# Patient Record
Sex: Female | Born: 1985 | Race: White | Hispanic: No | Smoking: Never smoker
Health system: Southern US, Community
[De-identification: ages and names within clinical notes are randomized; demographics above are authoritative.]

## PROBLEM LIST (undated history)

## (undated) DIAGNOSIS — R519 Headache, unspecified: Secondary | ICD-10-CM

## (undated) DIAGNOSIS — F32A Depression, unspecified: Secondary | ICD-10-CM

## (undated) DIAGNOSIS — R51 Headache: Secondary | ICD-10-CM

## (undated) DIAGNOSIS — J45909 Unspecified asthma, uncomplicated: Secondary | ICD-10-CM

## (undated) DIAGNOSIS — J069 Acute upper respiratory infection, unspecified: Secondary | ICD-10-CM

## (undated) DIAGNOSIS — F329 Major depressive disorder, single episode, unspecified: Secondary | ICD-10-CM

## (undated) HISTORY — DX: Acute upper respiratory infection, unspecified: J06.9

## (undated) HISTORY — DX: Unspecified asthma, uncomplicated: J45.909

## (undated) HISTORY — PX: LIPOSUCTION MULTIPLE BODY PARTS: SUR832

## (undated) HISTORY — PX: OTHER SURGICAL HISTORY: SHX169

---

## 2013-07-12 ENCOUNTER — Ambulatory Visit (INDEPENDENT_AMBULATORY_CARE_PROVIDER_SITE_OTHER): Payer: BC Managed Care – PPO | Admitting: Sports Medicine

## 2013-07-12 ENCOUNTER — Encounter: Payer: Self-pay | Admitting: Sports Medicine

## 2013-07-12 VITALS — BP 126/79 | HR 66 | Wt 180.0 lb

## 2013-07-12 DIAGNOSIS — F329 Major depressive disorder, single episode, unspecified: Secondary | ICD-10-CM | POA: Insufficient documentation

## 2013-07-12 DIAGNOSIS — J3089 Other allergic rhinitis: Secondary | ICD-10-CM | POA: Insufficient documentation

## 2013-07-12 DIAGNOSIS — Z299 Encounter for prophylactic measures, unspecified: Secondary | ICD-10-CM

## 2013-07-12 DIAGNOSIS — R41840 Attention and concentration deficit: Secondary | ICD-10-CM

## 2013-07-12 DIAGNOSIS — J309 Allergic rhinitis, unspecified: Secondary | ICD-10-CM

## 2013-07-12 DIAGNOSIS — E669 Obesity, unspecified: Secondary | ICD-10-CM

## 2013-07-12 DIAGNOSIS — E6609 Other obesity due to excess calories: Secondary | ICD-10-CM | POA: Insufficient documentation

## 2013-07-12 MED ORDER — FLUTICASONE PROPIONATE 50 MCG/ACT NA SUSP: NASAL | Status: DC

## 2013-07-12 MED ORDER — PHENTERMINE HCL 37.5 MG PO TABS: 37.5000 mg | ORAL_TABLET | Freq: Every day | ORAL | Status: DC

## 2013-07-12 MED ORDER — TOPIRAMATE 50 MG PO TABS: ORAL_TABLET | ORAL | Status: DC

## 2013-07-12 NOTE — Assessment & Plan Note (Signed)
Continue oral antihistamine. Flonase. Return as needed.

## 2013-07-12 NOTE — Assessment & Plan Note (Signed)
Up-to-date of cervical cancer screening, uses [facility] [place].

## 2013-07-12 NOTE — Assessment & Plan Note (Signed)
Sending to Dr. Dellia Cloud for ADHD testing.

## 2013-07-12 NOTE — Progress Notes (Signed)
  Subjective:    CC: Establish care.   HPI:  Obesity: Has tried exercise, works out approximately 6-7 days per week, has not been able to lose much weight, has not really worked on dieting. She has had a tummy tuck x2 as well as liposuction.  Raynaud's: Likely allergic rhinitis, takes an occasional Zyrtec without much improvement.  Difficulty concentrating: Often loses track of what she is doing, and has difficulty focusing, she was on phentermine in the past and noted a slight improvement in her symptoms, she wonders if she may have adult ADHD.  Preventative measures: Up-to-date on all screening measures, gets Pap sm[facility] through [facility] [place].  Past medical history, Surgical history, Family history not pertinant except as noted below, Social history, Allergies, and medications have been entered into the medical record, reviewed, and no changes needed.   Review of Systems: No headache, visual changes, nausea, vomiting, diarrhea, constipation, dizziness, abdominal pain, skin rash, fevers, chills, night sweats, swollen lymph nodes, weight loss, chest pain, body aches, joint swelling, muscle aches, shortness of breath, mood changes, visual or auditory hallucinations.  Objective:    General: Well Developed, well nourished, and in no acute distress.  Neuro: Alert and oriented x3, extra-ocular muscles intact, sensation grossly intact.  HEENT: Normocephalic, atraumatic, pupils equal round reactive to light, neck supple, no masses, no lymphadenopathy, thyroid nonpalpable.  Skin: Warm and dry, no rashes noted.  Cardiac: Regular rate and rhythm, no murmurs rubs or gallops.  Respiratory: Clear to auscultation bilaterally. Not using accessory muscles, speaking in full sentences.  Abdominal: Soft, nontender, nondistended, positive bowel sounds, no masses, no organomegaly.  Musculoskeletal: Shoulder, elbow, wrist, hip, knee, ankle stable, and with full range of motion. Impression  and Recommendations:    The patient was counselled, risk factors were discussed, anticipatory guidance given.

## 2013-07-12 NOTE — Assessment & Plan Note (Signed)
Status post liposuction, tummy tuck x2. Phentermine, Topamax, nutritionist referral.

## 2013-08-01 LAB — HEMOGLOBIN A1C
Hgb A1c MFr Bld: 4.9 % (ref <5.7)
Mean Plasma Glucose: 94 mg/dL (ref <117)

## 2013-08-01 LAB — CBC
HCT: 38 % (ref 36.0–46.0)
Hemoglobin: 13.4 g/dL (ref 12.0–15.0)
MCH: 32.4 pg (ref 26.0–34.0)
MCHC: 35.3 g/dL (ref 30.0–36.0)
MCV: 91.8 fL (ref 78.0–100.0)
Platelets: 173 10*3/uL (ref 150–400)
RBC: 4.14 MIL/uL (ref 3.87–5.11)
RDW: 13.5 % (ref 11.5–15.5)
WBC: 6.3 10*3/uL (ref 4.0–10.5)

## 2013-08-01 LAB — COMPREHENSIVE METABOLIC PANEL WITH GFR
ALT: 10 U/L (ref 0–35)
AST: 13 U/L (ref 0–37)
Albumin: 4.2 g/dL (ref 3.5–5.2)
Alkaline Phosphatase: 70 U/L (ref 39–117)
BUN: 11 mg/dL (ref 6–23)
CO2: 23 meq/L (ref 19–32)
Calcium: 9.4 mg/dL (ref 8.4–10.5)
Chloride: 107 meq/L (ref 96–112)
Creat: 0.84 mg/dL (ref 0.50–1.10)
Glucose, Bld: 87 mg/dL (ref 70–99)
Potassium: 4.3 meq/L (ref 3.5–5.3)
Sodium: 136 meq/L (ref 135–145)
Total Bilirubin: 0.8 mg/dL (ref 0.3–1.2)
Total Protein: 6.8 g/dL (ref 6.0–8.3)

## 2013-08-01 LAB — TSH: TSH: 2.339 u[IU]/mL (ref 0.350–4.500)

## 2013-08-01 LAB — LIPID PANEL
Cholesterol: 84 mg/dL (ref 0–200)
HDL: 44 mg/dL (ref >39)
LDL Cholesterol: 26 mg/dL (ref 0–99)
Total CHOL/HDL Ratio: 1.9 Ratio
Triglycerides: 70 mg/dL (ref <150)
VLDL: 14 mg/dL (ref 0–40)

## 2013-08-09 ENCOUNTER — Ambulatory Visit (INDEPENDENT_AMBULATORY_CARE_PROVIDER_SITE_OTHER): Payer: BC Managed Care – PPO | Admitting: Sports Medicine

## 2013-08-09 ENCOUNTER — Encounter: Payer: Self-pay | Admitting: Sports Medicine

## 2013-08-09 VITALS — BP 126/86 | HR 86 | Wt 170.0 lb

## 2013-08-09 DIAGNOSIS — R41840 Attention and concentration deficit: Secondary | ICD-10-CM

## 2013-08-09 DIAGNOSIS — Z299 Encounter for prophylactic measures, unspecified: Secondary | ICD-10-CM

## 2013-08-09 DIAGNOSIS — Z23 Encounter for immunization: Secondary | ICD-10-CM

## 2013-08-09 DIAGNOSIS — E669 Obesity, unspecified: Secondary | ICD-10-CM

## 2013-08-09 MED ORDER — PHENTERMINE HCL 37.5 MG PO TABS: 37.5000 mg | ORAL_TABLET | Freq: Every day | ORAL | Status: DC

## 2013-08-09 MED ORDER — TOPIRAMATE 50 MG PO TABS: 50.0000 mg | ORAL_TABLET | ORAL | Status: DC

## 2013-08-09 NOTE — Assessment & Plan Note (Addendum)
10 pound weight loss in the past month. Switching Topamax to the morning, continue phentermine. Still awaiting nutritionist visit.

## 2013-08-09 NOTE — Progress Notes (Signed)
  Subjective:    CC: Followup  HPI: Obesity: 10 pound weight loss since starting phentermine and Topamax, she did go on vacation and had some indiscretions with eating over the past 2 weeks. Has not yet seen the nutritionist.  Difficulty concentrating: Is going to set up appointment with Dr. Dellia Cloud for ADD testing.  Preventive measure: Up-to-date on cervical cancer screening, due for influenza and tetanus shots.  Past medical history, Surgical history, Family history not pertinant except as noted below, Social history, Allergies, and medications have been entered into the medical record, reviewed, and no changes needed.   Review of Systems: No fevers, chills, night sweats, weight loss, chest pain, or shortness of breath.   Objective:    General: Well Developed, well nourished, and in no acute distress.  Neuro: Alert and oriented x3, extra-ocular muscles intact, sensation grossly intact.  HEENT: Normocephalic, atraumatic, pupils equal round reactive to light, neck supple, no masses, no lymphadenopathy, thyroid nonpalpable.  Skin: Warm and dry, no rashes. Cardiac: Regular rate and rhythm, no murmurs rubs or gallops, no lower extremity edema.  Respiratory: Clear to auscultation bilaterally. Not using accessory muscles, speaking in full sentences.  Impression and Recommendations:

## 2013-08-09 NOTE — Assessment & Plan Note (Signed)
Up-to-date of cervical cancer screening, tetanus and flu vaccinations given today.

## 2013-08-09 NOTE — Assessment & Plan Note (Signed)
Still awaiting Dr. Dawayne Cirri ADD evaluation.

## 2013-09-02 ENCOUNTER — Encounter: Payer: Self-pay | Admitting: *Deleted

## 2013-09-02 ENCOUNTER — Encounter: Payer: BC Managed Care – PPO | Attending: Sports Medicine | Admitting: *Deleted

## 2013-09-02 VITALS — Ht 64.0 in | Wt 166.7 lb

## 2013-09-02 DIAGNOSIS — E669 Obesity, unspecified: Secondary | ICD-10-CM | POA: Insufficient documentation

## 2013-09-02 DIAGNOSIS — Z713 Dietary counseling and surveillance: Secondary | ICD-10-CM | POA: Insufficient documentation

## 2013-09-02 NOTE — Progress Notes (Signed)
Medical Nutrition Therapy:  Appt start time: 1045 end time:  1145.  Assessment:  Patient here today for weight management counseling. She has been on phentermine and Topamax for the last 2 months, and has already lost about 14 pounds. Weight loss has slowed in the last month, but is still 1 pound per week. She states that her appetite is decreased, which has been controlling portion control and appetite. She states that prior to taking medication, her biggest issue was overeating at meals, and snacking frequently. She knows how to eat healthy, but self-control is an issue. She has been exercising regularly for the last 2 years. Goal weight is about 150 pounds.   MEDICATIONS: Phentermine, topamax   DIETARY INTAKE:   Usual eating pattern includes 2-3 meals and 0 snacks per day.  24-hr recall:  B (7 AM): Subway flatbread (egg white, ham, mustard) OR Fiber One bar/protein shake with fruit/PB  Snk ( AM): None  L ( PM): Salad (chef or chicken), Ranch /vinegrette Snk ( PM): None D ( PM): Sometimes skips, lettuce wraps (chicken) Snk ( PM): None Beverages: Water  Usual physical activity: 4 days a week, cross training/circuit training/running, 60 minutes  Estimated energy needs: 1400 calories 175 g carbohydrates 88 g protein 39 g fat  Progress Towards Goal(s):  In progress.   Nutritional Diagnosis:  [place]-3.3 Overweight/obesity As related to history of excessive energy intake.  As evidenced by BMI 28.7.    Intervention:  Nutrition counseling. We discussed strategies for weight loss, including balancing nutrients (carbs, protein, fat), portion control, healthy snacks, and exercise.   Goals:  1. 1 pound weight loss per week. Goal weight: 150 pounds 2. Balance nutrients at meals with high intake of non-starchy vegetables.  3. Monitor portion size.  4. If needed, plan for healthy snacks and limit portions.  5. Continue exercising as currently.   Handouts given during visit include:  Yellow  meal plan card  Monitoring/Evaluation:  Dietary intake, exercise, and body weight prn.

## 2013-09-07 ENCOUNTER — Encounter: Payer: Self-pay | Admitting: Sports Medicine

## 2013-09-07 ENCOUNTER — Ambulatory Visit (INDEPENDENT_AMBULATORY_CARE_PROVIDER_SITE_OTHER): Payer: BC Managed Care – PPO | Admitting: Sports Medicine

## 2013-09-07 VITALS — BP 115/78 | HR 77 | Wt 162.0 lb

## 2013-09-07 DIAGNOSIS — E669 Obesity, unspecified: Secondary | ICD-10-CM

## 2013-09-07 MED ORDER — TOPIRAMATE 50 MG PO TABS: 50.0000 mg | ORAL_TABLET | ORAL | Status: DC

## 2013-09-07 MED ORDER — PHENTERMINE HCL 37.5 MG PO TABS: 37.5000 mg | ORAL_TABLET | Freq: Every day | ORAL | Status: DC

## 2013-09-07 NOTE — Assessment & Plan Note (Signed)
Total of approximately 20 pound weight loss in 2 months. Refilling phentermine and Topamax. She does have some problem areas around the love handles and upper gluteal region, we talked in depth regarding how phentermine cannot target specific areas. She has had an abdominoplasty in the past and does not desire an additional scar, plastic surgeon does recommend a repeat operation. I have recommended cool sculpting as a possible way to target her problem areas, she will discuss this with her plastic surgeon.

## 2013-09-07 NOTE — Progress Notes (Signed)
  Subjective:    CC: Followup  HPI: Obesity: This pleasant 27 year old female comes back for followup of weight loss, she is now lost approximately 20 pounds we initially started her phentermine treatment. Unfortunately she continues to have a problem area with some redundant skin over her left and a little bit over her right upper gluteal area. She has had an abdominoplasty in the distant past, and desires not to proceed with any further surgical intervention at this time.  Past medical history, Surgical history, Family history not pertinant except as noted below, Social history, Allergies, and medications have been entered into the medical record, reviewed, and no changes needed.   Review of Systems: No fevers, chills, night sweats, weight loss, chest pain, or shortness of breath.   Objective:    General: Well Developed, well nourished, and in no acute distress.  Neuro: Alert and oriented x3, extra-ocular muscles intact, sensation grossly intact.  HEENT: Normocephalic, atraumatic, pupils equal round reactive to light, neck supple, no masses, no lymphadenopathy, thyroid nonpalpable.  Skin: Warm and dry, no rashes. She does have some redundancy of skin over her left and right upper gluteal areas, her prior abdominoplasty scars seems to have healed with hypertrophic scarring. Cardiac: Regular rate and rhythm, no murmurs rubs or gallops, no lower extremity edema.  Respiratory: Clear to auscultation bilaterally. Not using accessory muscles, speaking in full sentences.  Impression and Recommendations:

## 2013-09-29 ENCOUNTER — Ambulatory Visit: Payer: BC Managed Care – PPO | Admitting: Sports Medicine

## 2013-10-05 ENCOUNTER — Ambulatory Visit (INDEPENDENT_AMBULATORY_CARE_PROVIDER_SITE_OTHER): Payer: BC Managed Care – PPO | Admitting: Sports Medicine

## 2013-10-05 ENCOUNTER — Encounter: Payer: Self-pay | Admitting: Sports Medicine

## 2013-10-05 VITALS — BP 116/71 | HR 93 | Wt 161.0 lb

## 2013-10-05 DIAGNOSIS — E669 Obesity, unspecified: Secondary | ICD-10-CM

## 2013-10-05 MED ORDER — PHENTERMINE HCL 37.5 MG PO CAPS: 37.5000 mg | ORAL_CAPSULE | ORAL | Status: DC

## 2013-10-05 NOTE — Progress Notes (Signed)
  Subjective:    CC: Followup  HPI: Obesity: This very pleasant 27 year old female comes back to see me for followup of weight loss treatment. Since her initial visit she's lost approximately 20 pounds. Unfortunately over the last month she's only lost a single pound and triggered this to dietary indiscretions during the holidays. She also has not been using her Topamax. She has no side effects and is eager to get back on the bandwagon.  Past medical history, Surgical history, Family history not pertinant except as noted below, Social history, Allergies, and medications have been entered into the medical record, reviewed, and no changes needed.   Review of Systems: No fevers, chills, night sweats, weight loss, chest pain, or shortness of breath.   Objective:    General: Well Developed, well nourished, and in no acute distress.  Neuro: Alert and oriented x3, extra-ocular muscles intact, sensation grossly intact.  HEENT: Normocephalic, atraumatic, pupils equal round reactive to light, neck supple, no masses, no lymphadenopathy, thyroid nonpalpable.  Skin: Warm and dry, no rashes. Cardiac: Regular rate and rhythm, no murmurs rubs or gallops, no lower extremity edema.  Respiratory: Clear to auscultation bilaterally. Not using accessory muscles, speaking in full sentences.  Impression and Recommendations:

## 2013-10-05 NOTE — Assessment & Plan Note (Signed)
20 pound weight loss since starting phentermine, however unfortunately since last month has only lost 1 pound. She attributes this to forgetting her Topamax and dietary indiscretion through the holidays. We will refill the medicine, we'll see her back in one month. She will get back on the bandwagon.

## 2013-11-04 ENCOUNTER — Ambulatory Visit: Payer: BC Managed Care – PPO | Admitting: Sports Medicine

## 2013-11-04 DIAGNOSIS — Z0289 Encounter for other administrative examinations: Secondary | ICD-10-CM

## 2013-11-17 ENCOUNTER — Encounter: Payer: Self-pay | Admitting: Sports Medicine

## 2013-11-17 ENCOUNTER — Ambulatory Visit (INDEPENDENT_AMBULATORY_CARE_PROVIDER_SITE_OTHER): Payer: BC Managed Care – PPO | Admitting: Sports Medicine

## 2013-11-17 VITALS — BP 125/82 | HR 74 | Ht 64.0 in | Wt 167.0 lb

## 2013-11-17 DIAGNOSIS — J0100 Acute maxillary sinusitis, unspecified: Secondary | ICD-10-CM

## 2013-11-17 MED ORDER — AZITHROMYCIN 250 MG PO TABS: ORAL_TABLET | ORAL | Status: DC

## 2013-11-17 NOTE — Patient Instructions (Signed)

## 2013-11-17 NOTE — Progress Notes (Signed)
  Subjective:    CC: Sick  HPI: For the past week Michelle Berger has had sinus pain and pressure radiating to her left ear, nasal discharge, coughing. No GI symptoms. Symptoms are moderate, persistent.  Past medical history, Surgical history, Family history not pertinant except as noted below, Social history, Allergies, and medications have been entered into the medical record, reviewed, and no changes needed.   Review of Systems: No fevers, chills, night sweats, weight loss, chest pain, or shortness of breath.   Objective:    General: Well Developed, well nourished, and in no acute distress.  Neuro: Alert and oriented x3, extra-ocular muscles intact, sensation grossly intact.  HEENT: Normocephalic, atraumatic, pupils equal round reactive to light, neck supple, no masses, no lymphadenopathy, thyroid nonpalpable. Tender to palpation over the maxillary sinuses, oropharynx, external ear canals are unremarkable, nasopharynx shows boggy and erythematous turbinates. Skin: Warm and dry, no rashes. Cardiac: Regular rate and rhythm, no murmurs rubs or gallops, no lower extremity edema.  Respiratory: Clear to auscultation bilaterally. Not using accessory muscles, speaking in full sentences.  Impression and Recommendations:

## 2013-11-17 NOTE — Assessment & Plan Note (Signed)
Azithromycin, Flonase, return as needed 

## 2013-12-01 ENCOUNTER — Ambulatory Visit: Payer: BC Managed Care – PPO | Admitting: Sports Medicine

## 2013-12-02 ENCOUNTER — Encounter: Payer: Self-pay | Admitting: Sports Medicine

## 2013-12-02 ENCOUNTER — Ambulatory Visit (INDEPENDENT_AMBULATORY_CARE_PROVIDER_SITE_OTHER): Payer: BC Managed Care – PPO | Admitting: Sports Medicine

## 2013-12-02 VITALS — BP 121/71 | HR 100 | Ht 64.0 in | Wt 163.0 lb

## 2013-12-02 DIAGNOSIS — Z299 Encounter for prophylactic measures, unspecified: Secondary | ICD-10-CM

## 2013-12-02 DIAGNOSIS — E669 Obesity, unspecified: Secondary | ICD-10-CM

## 2013-12-02 MED ORDER — TOPIRAMATE 50 MG PO TABS: 50.0000 mg | ORAL_TABLET | ORAL | Status: DC

## 2013-12-02 MED ORDER — PHENTERMINE HCL 37.5 MG PO CAPS: 37.5000 mg | ORAL_CAPSULE | ORAL | Status: DC

## 2013-12-02 MED ORDER — HYDROXYZINE HCL 50 MG PO TABS: 50.0000 mg | ORAL_TABLET | Freq: Every day | ORAL | Status: DC

## 2013-12-02 NOTE — Progress Notes (Signed)
  Subjective:    CC: Followup  HPI: Sinus infection: Resolved.  Obesity: Has fallen off the bandwagon and missed month of phentermine, she has gained 3 pounds.  Travel: Going [place] next week, needs medicine to help her sleep on the plane. She does have plenty of scopolamine for motion sickness.   Past medical history, Surgical history, Family history not pertinant except as noted below, Social history, Allergies, and medications have been entered into the medical record, reviewed, and no changes needed.   Review of Systems: No fevers, chills, night sweats, weight loss, chest pain, or shortness of breath.   Objective:    General: Well Developed, well nourished, and in no acute distress.  Neuro: Alert and oriented x3, extra-ocular muscles intact, sensation grossly intact.  HEENT: Normocephalic, atraumatic, pupils equal round reactive to light, neck supple, no masses, no lymphadenopathy, thyroid nonpalpable.  Skin: Warm and dry, no rashes. Cardiac: Regular rate and rhythm, no murmurs rubs or gallops, no lower extremity edema.  Respiratory: Clear to auscultation bilaterally. Not using accessory muscles, speaking in full sentences.  Impression and Recommendations:

## 2013-12-02 NOTE — Assessment & Plan Note (Signed)
Refilling phentermine and Topamax. She needs to get back on the band wagon. Return in one month for a weight check and refills, I think 140 pounds would look best on her. She agrees that this is her goal weight.

## 2013-12-02 NOTE — Assessment & Plan Note (Signed)
Going [place]. She does have plenty of scopolamine patches. Adding hydroxyzine for sedation on the plane.

## 2013-12-30 ENCOUNTER — Ambulatory Visit: Payer: BC Managed Care – PPO | Admitting: Sports Medicine

## 2013-12-30 DIAGNOSIS — Z0289 Encounter for other administrative examinations: Secondary | ICD-10-CM

## 2014-05-31 ENCOUNTER — Emergency Department
Admission: EM | Admit: 2014-05-31 | Discharge: 2014-05-31 | Disposition: A | Discharge status: Home / Self Care | Payer: BC Managed Care – PPO | Source: Home / Self Care | Attending: Family Medicine | Admitting: Family Medicine

## 2014-05-31 ENCOUNTER — Emergency Department (INDEPENDENT_AMBULATORY_CARE_PROVIDER_SITE_OTHER): Payer: BC Managed Care – PPO

## 2014-05-31 ENCOUNTER — Encounter: Payer: Self-pay | Admitting: Emergency Medicine

## 2014-05-31 DIAGNOSIS — B9789 Other viral agents as the cause of diseases classified elsewhere: Secondary | ICD-10-CM

## 2014-05-31 DIAGNOSIS — J069 Acute upper respiratory infection, unspecified: Secondary | ICD-10-CM

## 2014-05-31 DIAGNOSIS — R51 Headache: Secondary | ICD-10-CM

## 2014-05-31 HISTORY — DX: Headache: R51

## 2014-05-31 HISTORY — DX: Headache, unspecified: R51.9

## 2014-05-31 LAB — POCT CBC W AUTO DIFF (K'VILLE URGENT CARE)

## 2014-05-31 MED ORDER — KETOROLAC TROMETHAMINE 60 MG/2ML IM SOLN
60.0000 mg | Freq: Once | INTRAMUSCULAR | Status: AC
  Administered 2014-05-31: 60 mg via INTRAMUSCULAR

## 2014-05-31 MED ORDER — PROMETHAZINE HCL 25 MG/ML IJ SOLN
25.0000 mg | Freq: Once | INTRAMUSCULAR | Status: AC
  Administered 2014-05-31: 25 mg via INTRAMUSCULAR

## 2014-05-31 MED ORDER — ISOMETHEPTENE-APAP-DICHLORAL 65-325-100 MG PO CAPS: 1.0000 | ORAL_CAPSULE | Freq: Four times a day (QID) | ORAL | Status: DC | PRN

## 2014-05-31 MED ORDER — PROMETHAZINE HCL 25 MG PO TABS: 25.0000 mg | ORAL_TABLET | Freq: Four times a day (QID) | ORAL | Status: DC | PRN

## 2014-05-31 MED ORDER — AZITHROMYCIN 250 MG PO TABS: ORAL_TABLET | ORAL | Status: DC

## 2014-05-31 NOTE — ED Provider Notes (Signed)
CSN: [id number]     Arrival date & time 05/31/14  1328 History   First MD Initiated Contact with Patient 05/31/14 1357     Chief Complaint  Patient presents with  . Headache      HPI Comments: Patient states that she travelled by motorcycle to [place] two weeks ago, and during her trip had intermittent sinus congestion and mild headache that was controlled with Sudafed (phenylephrine).  She states that she has a history of seasonal rhinitis (autumn) controlled with Zyrtec, and has had frequent episodes of sinusitis in the past.   She has a history of frequent mild headaches (often daily) that had been controlled with Topamax in the past.  Over the past two days she has developed a more severe generalized headache than she has had in the past, although it has not awakened her.  She has had mild blurred vision and nausea (but no vomiting).  She states that she has been extremely fatigued over the past two days and could sleep anytime.  She has no localizing neurologic symptoms.  She states that her ears have been "popping" and she has had difficulty equalizing pressure in her ears.  No sore throat.  Patient is a 28 y.o. female presenting with headaches. The history is provided by the patient.  Headache Pain location:  Generalized Quality: constant throbbing ache. Radiates to:  Does not radiate Severity currently:  8/10 Severity at highest:  8/10 Onset quality:  Gradual Duration:  2 days Timing:  Constant Progression:  Unchanged Chronicity:  New Similar to prior headaches: no   Context: activity and bright light   Relieved by:  Nothing Worsened by:  Light and activity Ineffective treatments:  NSAIDs Associated symptoms: blurred vision, congestion, cough, drainage, ear pain, facial pain, fatigue, nausea, photophobia and sinus pressure   Associated symptoms: no abdominal pain, no diarrhea, no dizziness, no pain, no fever, no focal weakness, no hearing loss, no loss of balance, no  myalgias, no near-syncope, no neck pain, no neck stiffness, no numbness, no paresthesias, no seizures, no sore throat, no swollen glands, no syncope, no tingling, no URI, no visual change, no vomiting and no weakness     Past Medical History  Diagnosis Date  . Headache    Past Surgical History  Procedure Laterality Date  . Tummy tuck    . Liposuction multiple body parts N/A     abdomen and back   History reviewed. No pertinent family history. History  Substance Use Topics  . Smoking status: Never Smoker   . Smokeless tobacco: Never Used  . Alcohol Use: 3.6 oz/week    6 Glasses of wine per week   OB History   Grav Para Term Preterm Abortions TAB SAB Ect Mult Living                 Review of Systems  Constitutional: Positive for fatigue. Negative for fever.  HENT: Positive for congestion, ear pain, postnasal drip and sinus pressure. Negative for hearing loss and sore throat.   Eyes: Positive for blurred vision and photophobia. Negative for pain.  Respiratory: Positive for cough.   Cardiovascular: Negative for syncope and near-syncope.  Gastrointestinal: Positive for nausea. Negative for vomiting, abdominal pain and diarrhea.  Musculoskeletal: Negative for myalgias, neck pain and neck stiffness.  Neurological: Positive for headaches. Negative for dizziness, focal weakness, seizures, numbness, paresthesias and loss of balance.    Allergies  Sulfur  Home Medications   Prior to Admission medications  Medication Sig Start Date End Date Taking? Authorizing Provider  azithromycin (ZITHROMAX Z-PAK) 250 MG tablet Take 2 tabs today; then begin one tab once daily for 4 more days. (Rx void after 06/08/14) 05/31/14   Lattie Haw, MD  fluticasone [facility]) 50 MCG/ACT nasal spray One spray in each nostril twice a day, use left hand for right nostril, and right hand for left nostril. 07/12/13   Monica Becton, MD  isometheptene-acetaminophen-dichloralphenazone (MIDRIN) [phone]  MG capsule Take 1 capsule by mouth 4 (four) times daily as needed for headache. Maximum 6 capsules in 24 hours 05/31/14   Lattie Haw, MD  phentermine 37.5 MG capsule Take 1 capsule (37.5 mg total) by mouth every morning. 12/02/13   Monica Becton, MD  promethazine (PHENERGAN) 25 MG tablet Take 1 tablet (25 mg total) by mouth every 6 (six) hours as needed for nausea or vomiting. 05/31/14   Lattie Haw, MD  topiramate (TOPAMAX) 50 MG tablet Take 1 tablet (50 mg total) by mouth every morning. 12/02/13   Monica Becton, MD   BP 119/89  Pulse 90  Temp(Src) 98.3 F (36.8 C) (Oral)  Resp 18  Ht 5\' 3"  (1.6 m)  Wt 180 lb (81.647 kg)  BMI 31.89 kg/m2  SpO2 99%  LMP 05/24/2014 Physical Exam Nursing notes and Vital Signs reviewed. Appearance:  Patient appears stated age, and in no acute distress.  Patient is obese (BMI 31.9).  She is alert and oriented.  Eyes:  Pupils are equal, round, and reactive to light and accomodation.  Extraocular movement is intact.  Conjunctivae are not inflamed.  Mild photophobia present. Ears:  Canals normal.  Tympanic membranes normal.  Nose:  Mildly congested turbinates.   Mild maxillary sinus tenderness is present.  Pharynx:  Normal Neck:  Supple.   Tender enlarged posterior nodes are palpated bilaterally  Lungs:  Clear to auscultation.  Breath sounds are equal.  Chest:  Distinct tenderness to palpation over the mid-sternum.  Heart:  Regular rate and rhythm without murmurs, rubs, or gallops.  Abdomen:  Nontender without masses or hepatosplenomegaly.  Bowel souns are present.  No CVA or flank tenderness.  Extremities:  No edema.  No calf tenderness Skin:  No rash present.  Neurologic:  Cranial nerves 2 through 12 are normal.  Patellar and elbow reflexes are normal.  Cerebellar function is intact (finger-to-nose and rapid alternating hand movement).  Gait and station are normal.    ED Course  Procedures  none    Labs Reviewed  POCT CBC W AUTO DIFF  ([facility]):  WBC 9.0; LY 24.1; MO 1.7; GR 74.2; Hgb 13.2; Platelets 183     Imaging Review Dg Sinuses Complete  05/31/2014   CLINICAL DATA:  Facial pressure and headache.  EXAM: PARANASAL SINUSES - COMPLETE 3 + VIEW  COMPARISON:  None.  FINDINGS: Frontal, ethmoid, maxillary and sphenoid sinuses are clear. No evidence of mucosal thickening or fluid level.  IMPRESSION: Negative   Electronically Signed   By: Paulina Fusi M.D.   On: 05/31/2014 14:50     MDM   1. Headache(784.0); suspect patient's usual headache intensified by viral illness  2. Viral URI with cough    Toradol 60mg  IM and Phenergan 25mg  IM for present headache. May continue Phenergan 25mg  q6hr PO prn nausea.  Trial of Midrin for headache.  Stop Ibuprofen. If cold symptoms increased, may begin the following: Take plain Mucinex (1200 mg guaifenesin) twice daily for cough and congestion.  May add Sudafed for sinus congestion.   Increase fluid intake, rest. May use Afrin nasal spray (or generic oxymetazoline) twice daily for about 5 days.  Also recommend using saline nasal spray several times daily and saline nasal irrigation (AYR is a common brand) May take Delsym Cough Suppressant at bedtime for nighttime cough.  Stop all antihistamines for now, and other non-prescription cough/cold preparations. Begin Azithromycin if not improving about 5 days or if persistent fever develops (Given a prescription to hold, with an expiration date)  Follow-up with family doctor if not improving 7 to 10 days.     Lattie HawStephen A Leam Madero, MD 05/31/14 [phone]

## 2014-05-31 NOTE — ED Notes (Signed)
Patient reports traveling via car to [place] in [place] for 12 days returning 2 days ago. C/o sinus congestion, productive cough with yellow secretions for 2 weeks.  Patient reports taking OTC medications for s/s including Ibuprofen, sudafed and tylenol.  Denies fever.  Today patient presents with c/o of worsening of headache with blurring vision, pulsing HA pain, nausea and dry cough. Patient states that she took Phentermine 37.5mg  at 9am today.  Patient states that she also has Topamax but has not taken today.  Also reports being seen by HCP Dr. Karie Schwalbe for diagnosis of "Stress Headaches."

## 2014-05-31 NOTE — Discharge Instructions (Signed)
Stop Ibuprofen. If cold symptoms increased, may begin the following: Take plain Mucinex (1200 mg guaifenesin) twice daily for cough and congestion.  May add Sudafed for sinus congestion.   Increase fluid intake, rest. May use Afrin nasal spray (or generic oxymetazoline) twice daily for about 5 days.  Also recommend using saline nasal spray several times daily and saline nasal irrigation (AYR is a common brand) May take Delsym Cough Suppressant at bedtime for nighttime cough.  Stop all antihistamines for now, and other non-prescription cough/cold preparations. Begin Azithromycin if not improving about 5 days or if persistent fever develops  Follow-up with family doctor if not improving 7 to 10 days.

## 2014-06-03 ENCOUNTER — Telehealth: Payer: Self-pay | Admitting: Emergency Medicine

## 2015-01-04 ENCOUNTER — Encounter: Payer: Self-pay | Admitting: Sports Medicine

## 2015-01-04 ENCOUNTER — Ambulatory Visit (INDEPENDENT_AMBULATORY_CARE_PROVIDER_SITE_OTHER): Payer: BLUE CROSS/BLUE SHIELD | Admitting: Sports Medicine

## 2015-01-04 VITALS — BP 110/76 | HR 70 | Ht 64.0 in | Wt 189.0 lb

## 2015-01-04 DIAGNOSIS — M654 Radial styloid tenosynovitis [de Quervain]: Secondary | ICD-10-CM

## 2015-01-04 DIAGNOSIS — Z8669 Personal history of other diseases of the nervous system and sense organs: Secondary | ICD-10-CM

## 2015-01-04 DIAGNOSIS — F320 Major depressive disorder, single episode, mild: Secondary | ICD-10-CM

## 2015-01-04 MED ORDER — DULOXETINE HCL 30 MG PO CPEP: 30.0000 mg | ORAL_CAPSULE | Freq: Every day | ORAL | Status: DC

## 2015-01-04 NOTE — Assessment & Plan Note (Signed)
Restart Topamax

## 2015-01-04 NOTE — Progress Notes (Signed)
  Subjective:    CC: Headaches  HPI: Patient presents with complaint of headaches that have increased in frequency over the past year. She describes 2 types of headaches. The first type of headache occurs 3-4 times per month, presents with 8/10 pain that lasts for several hours and is relieved by laying down in a quiet room or sleeping. The headache is associated with mild nausea and photophobia. She denies any aura or changes in vision. She took topiramate in the past which controlled her HA, but she stopped taking this 9 months ago as she believed it was for weight loss. The second type of headache occurs daily, presents with 4/10 pain at the front and both sides of her head and is relieved by ibuprofen. The patient takes 800 mg of ibuprofen every other day.  She believes the headache is due to stress because it usually starts at work. 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 She drinks 3 cups of coffee per day and has difficulty concentrating at work. She also reports increased mood swings and feelings of frustration.  Right wrist pain: Present for a couple of weeks, moderate, persistent without radiation. Localized over the first extensor compartment.  Past medical history, Surgical history, Family history not pertinant except as noted below, Social history, Allergies, and medications have been entered into the medical record, reviewed, and no changes needed.   Review of Systems: No fevers, chills, night sweats, weight loss, chest pain, or shortness of breath.   Objective:    General: Well Developed, well nourished, and in no acute distress. Neuro: Alert and oriented x3, extra-ocular muscles intact, sensation grossly intact.  HEENT: Normocephalic, atraumatic, pupils equal round reactive to light, neck supple, no masses, no lymphadenopathy, thyroid nonpalpable.  Skin: Warm and dry, no rashes. Cardiac: Regular rate and rhythm, no murmurs  rubs or gallops, no lower extremity edema.  Respiratory: Clear to auscultation bilaterally. Not using accessory muscles, speaking in full sentences. Psych: Insight and judgement intact. Patient appears anxious, became tearful at times during interview. Right Wrist: Inspection normal with no visible erythema or swelling. ROM smooth and normal with good flexion and extension and ulnar/radial deviation that is symmetrical with opposite wrist. Palpation is normal over metacarpals, navicular, lunate, and TFCC; tendons without tenderness/ swelling No snuffbox tenderness. No tenderness over Canal of Gu[place]. Strength 5/5 in all directions without pain. Positive Finkelstein sign, tinel's and phalens. Negative Watson's test.  Impression and Recommendations:    # Migraines  - Explained migraine prophylaxis using topiramate to patient - Patient to restart topiramate 25 mg daily for 1 week, then 50 mg daily  # Major Depression - Patient with symptoms of mood swings, difficulty concentrating, difficulty sleeping, decreased appetite for greater than 2 weeks - PHQ 9 score of 13 today, will follow up at next visit - Begin Cymbalta 30 mg daily - Patient referred for cognitive behavioral therapy  # Secondary Headaches - Suspect that patient's headaches are secondary to her anxiety and depression, anticipate symptoms will improve with treatment of depression - Patient encouraged to reduce frequency of ibuprofen use, and caffeine intake - Patient instructed to keep a diary of her symptoms  Follow up in 4 weeks or sooner as needed  I spent 40 minutes with this patient, greater than 50% was face-to-face time counseling regarding the above multiple diagnoses.

## 2015-01-04 NOTE — Assessment & Plan Note (Signed)
Home rehabilitation exercises given.

## 2015-01-04 NOTE — Assessment & Plan Note (Addendum)
Starting Cymbalta. Return in one month, GAD7 and PHQ9 at each visit. Also referral for counseling downstairs.

## 2015-02-01 ENCOUNTER — Encounter: Payer: Self-pay | Admitting: Sports Medicine

## 2015-02-01 ENCOUNTER — Ambulatory Visit (INDEPENDENT_AMBULATORY_CARE_PROVIDER_SITE_OTHER): Payer: BLUE CROSS/BLUE SHIELD | Admitting: Sports Medicine

## 2015-02-01 VITALS — BP 109/74 | HR 71 | Ht 64.0 in | Wt 195.0 lb

## 2015-02-01 DIAGNOSIS — F320 Major depressive disorder, single episode, mild: Secondary | ICD-10-CM | POA: Diagnosis not present

## 2015-02-01 DIAGNOSIS — Z8669 Personal history of other diseases of the nervous system and sense organs: Secondary | ICD-10-CM | POA: Diagnosis not present

## 2015-02-01 MED ORDER — DULOXETINE HCL 60 MG PO CPEP: 60.0000 mg | ORAL_CAPSULE | Freq: Every day | ORAL | Status: DC

## 2015-02-01 NOTE — Assessment & Plan Note (Signed)
Significant improvement. Increasing to 60 mg at bedtime. She certainly has a question about gaining weight on Cymbalta, she gained weight when depressed, and she will probably lose it as she comes out of the depression.

## 2015-02-01 NOTE — Assessment & Plan Note (Signed)
Improved significantly with Topamax, no changes in dose.

## 2015-02-01 NOTE — Progress Notes (Signed)
  Subjective:    CC: Follow-up  HPI: Major depression: Reports significant improvement since starting Cymbalta, with moderate difficulty sleeping, poor energy, and overeating, and only mild anhedonia and difficulty concentrating. She denies any suicidal or homicidal ideation, and is amenable to increase the dose.  Migraines: Improved significantly with the addition of Topamax.  Past medical history, Surgical history, Family history not pertinant except as noted below, Social history, Allergies, and medications have been entered into the medical record, reviewed, and no changes needed.   Review of Systems: No fevers, chills, night sweats, weight loss, chest pain, or shortness of breath.   Objective:    General: Well Developed, well nourished, and in no acute distress.  Neuro: Alert and oriented x3, extra-ocular muscles intact, sensation grossly intact.  HEENT: Normocephalic, atraumatic, pupils equal round reactive to light, neck supple, no masses, no lymphadenopathy, thyroid nonpalpable.  Skin: Warm and dry, no rashes. Cardiac: Regular rate and rhythm, no murmurs rubs or gallops, no lower extremity edema.  Respiratory: Clear to auscultation bilaterally. Not using accessory muscles, speaking in full sentences.  Impression and Recommendations:

## 2015-03-05 ENCOUNTER — Ambulatory Visit: Payer: BLUE CROSS/BLUE SHIELD | Admitting: Sports Medicine

## 2015-03-20 ENCOUNTER — Ambulatory Visit (HOSPITAL_COMMUNITY): Payer: BLUE CROSS/BLUE SHIELD | Admitting: Psychiatry

## 2015-03-23 ENCOUNTER — Ambulatory Visit: Payer: BLUE CROSS/BLUE SHIELD | Admitting: Sports Medicine

## 2015-04-04 ENCOUNTER — Ambulatory Visit (INDEPENDENT_AMBULATORY_CARE_PROVIDER_SITE_OTHER): Payer: BLUE CROSS/BLUE SHIELD | Admitting: Sports Medicine

## 2015-04-04 ENCOUNTER — Encounter: Payer: Self-pay | Admitting: Sports Medicine

## 2015-04-04 VITALS — BP 123/81 | HR 79 | Ht 63.0 in | Wt 195.0 lb

## 2015-04-04 DIAGNOSIS — E669 Obesity, unspecified: Secondary | ICD-10-CM | POA: Diagnosis not present

## 2015-04-04 DIAGNOSIS — Z8669 Personal history of other diseases of the nervous system and sense organs: Secondary | ICD-10-CM

## 2015-04-04 DIAGNOSIS — F320 Major depressive disorder, single episode, mild: Secondary | ICD-10-CM | POA: Diagnosis not present

## 2015-04-04 MED ORDER — DULOXETINE HCL 60 MG PO CPEP: 60.0000 mg | ORAL_CAPSULE | Freq: Every day | ORAL | Status: DC

## 2015-04-04 MED ORDER — LIRAGLUTIDE -WEIGHT MANAGEMENT 18 MG/3ML ~~LOC~~ SOPN: 3.0000 mg | PEN_INJECTOR | Freq: Every day | SUBCUTANEOUS | Status: DC

## 2015-04-04 NOTE — Assessment & Plan Note (Signed)
Did not have a good response or experience with the weight loss clinic. We will discontinue all oral weight loss medications and use Saxenda.

## 2015-04-04 NOTE — Assessment & Plan Note (Addendum)
Doing extremely well on 60 mg of Cymbalta, no changes. She will add some Benadryl or Motrin PM at night to help her sleep.

## 2015-04-04 NOTE — Progress Notes (Signed)
  Subjective:    CC: Follow-up  HPI: Obesity: Has not responded to phentermine, went to the weight loss clinic, did not have a good experience. Has never tried injectables.  Migraines: Resolved on Topamax.  Major depression: Doing extremely well on 60 mg of Cymbalta, mild anhedonia, overeating, and difficulty concentrating and moderate difficulty sleeping and poor energy, otherwise no symptoms.  Past medical history, Surgical history, Family history not pertinant except as noted below, Social history, Allergies, and medications have been entered into the medical record, reviewed, and no changes needed.   Review of Systems: No fevers, chills, night sweats, weight loss, chest pain, or shortness of breath.   Objective:    General: Well Developed, well nourished, and in no acute distress.  Neuro: Alert and oriented x3, extra-ocular muscles intact, sensation grossly intact.  HEENT: Normocephalic, atraumatic, pupils equal round reactive to light, neck supple, no masses, no lymphadenopathy, thyroid nonpalpable.  Skin: Warm and dry, no rashes. Cardiac: Regular rate and rhythm, no murmurs rubs or gallops, no lower extremity edema.  Respiratory: Clear to auscultation bilaterally. Not using accessory muscles, speaking in full sentences.  Impression and Recommendations:

## 2015-04-04 NOTE — Assessment & Plan Note (Signed)
Essentially resolved on Topamax.

## 2015-04-09 ENCOUNTER — Ambulatory Visit: Payer: BLUE CROSS/BLUE SHIELD | Admitting: Sports Medicine

## 2015-04-10 ENCOUNTER — Encounter (HOSPITAL_COMMUNITY): Payer: Self-pay | Admitting: Physician Assistant

## 2015-04-10 ENCOUNTER — Ambulatory Visit (INDEPENDENT_AMBULATORY_CARE_PROVIDER_SITE_OTHER): Payer: BLUE CROSS/BLUE SHIELD | Admitting: Physician Assistant

## 2015-04-10 DIAGNOSIS — F329 Major depressive disorder, single episode, unspecified: Secondary | ICD-10-CM

## 2015-04-10 DIAGNOSIS — F341 Dysthymic disorder: Secondary | ICD-10-CM

## 2015-04-10 NOTE — Patient Instructions (Signed)
1. Continue all medication as ordered. 2. Call this office if you have any questions or concerns. 3. Continue to get regular exercise 3-5 times a week. 4. Continue to eat a healthy nutritionally balanced diet. 5. Continue to reduce stress and anxiety through activities such as yoga, mindfulness, meditation and or prayer. 6. Keep all appointments with your out patient therapist and have notes forwarded to this office. (If you do not have one and would like to be scheduled with a therapist, please let our office assist you with this. 7. Follow up as planned.  Evidence based medicine supports that taking these two supplements will improve your overall mental health.  *Vitamin D3 (5000 i.u.) take one per day. *B complex take one per day.   Ask MD about TSH and Vitamin D levels please.
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
## 2015-04-12 ENCOUNTER — Telehealth: Payer: Self-pay | Admitting: Sports Medicine

## 2015-04-12 NOTE — Telephone Encounter (Signed)
Received fax for prior authorization on Saxenda sent through cover my meds and waiting on auth. - CF

## 2015-04-13 ENCOUNTER — Other Ambulatory Visit: Payer: Self-pay | Admitting: *Deleted

## 2015-04-13 DIAGNOSIS — E669 Obesity, unspecified: Secondary | ICD-10-CM

## 2015-04-13 MED ORDER — LIRAGLUTIDE -WEIGHT MANAGEMENT 18 MG/3ML ~~LOC~~ SOPN: 3.0000 mg | PEN_INJECTOR | Freq: Every day | SUBCUTANEOUS | Status: DC

## 2015-04-13 NOTE — Telephone Encounter (Signed)
Received fax from [facility] for Saxenda medication is approved from 04/12/2015 - 08/15/2015 Reference # [id number] - CF

## 2015-04-16 ENCOUNTER — Ambulatory Visit (INDEPENDENT_AMBULATORY_CARE_PROVIDER_SITE_OTHER): Payer: BLUE CROSS/BLUE SHIELD | Admitting: Sports Medicine

## 2015-04-16 VITALS — BP 131/85 | HR 83 | Wt 197.0 lb

## 2015-04-16 DIAGNOSIS — E669 Obesity, unspecified: Secondary | ICD-10-CM | POA: Diagnosis not present

## 2015-04-16 NOTE — Progress Notes (Signed)
Patient came into clinic today for education on administration of Saxenda injections. Patient brought the injections from the pharmacy and there were no needles with the Rx, some were provided in office today. Advised she can take the packaging back to her pharmacy and get more, if they need an Rx Pt was advised to contact us and we would send one over. Educated Pt on correct storage of injections and proper ways to dispose of used needles. Pt was informed of correct locations for administration, how to get her dosage dialed in, proper ways to clean the skin prior to administration and how to administer the injection. Pt was able to preform self injection well, with no immediate complications. Pt states "well that was easy." Advised Pt to contact us if she has any further questions.

## 2015-04-16 NOTE — Assessment & Plan Note (Signed)
Saxenda education as above.

## 2015-05-03 ENCOUNTER — Ambulatory Visit (INDEPENDENT_AMBULATORY_CARE_PROVIDER_SITE_OTHER): Payer: BLUE CROSS/BLUE SHIELD | Admitting: Sports Medicine

## 2015-05-03 ENCOUNTER — Encounter: Payer: Self-pay | Admitting: Sports Medicine

## 2015-05-03 VITALS — BP 118/80 | HR 82 | Ht 63.0 in | Wt 187.0 lb

## 2015-05-03 DIAGNOSIS — E669 Obesity, unspecified: Secondary | ICD-10-CM

## 2015-05-03 MED ORDER — ONDANSETRON 8 MG PO TBDP: 8.0000 mg | ORAL_TABLET | Freq: Three times a day (TID) | ORAL | Status: DC | PRN

## 2015-05-03 MED ORDER — PHENDIMETRAZINE TARTRATE ER 105 MG PO CP24: 1.0000 | ORAL_CAPSULE | Freq: Every day | ORAL | Status: DC

## 2015-05-03 NOTE — Progress Notes (Signed)
  Subjective:    CC: Weight check  HPI: Vernel has finally lost weight, 10 pounds in the last month since starting Saxenda. She is taking phendimetrazine from another provider, she never had any weight loss on phentermine. She did have some persistent nausea, moderate but tells me that she went from 0.6 mg to 2.4 mg accidentally and has stuck with 2.4 mg. Over the past day her nausea has finally resolved.  Past medical history, Surgical history, Family history not pertinant except as noted below, Social history, Allergies, and medications have been entered into the medical record, reviewed, and no changes needed.   Review of Systems: No fevers, chills, night sweats, weight loss, chest pain, or shortness of breath.   Objective:    General: Well Developed, well nourished, and in no acute distress.  Neuro: Alert and oriented x3, extra-ocular muscles intact, sensation grossly intact.  HEENT: Normocephalic, atraumatic, pupils equal round reactive to light, neck supple, no masses, no lymphadenopathy, thyroid nonpalpable.  Skin: Warm and dry, no rashes. Cardiac: Regular rate and rhythm, no murmurs rubs or gallops, no lower extremity edema.  Respiratory: Clear to auscultation bilaterally. Not using accessory muscles, speaking in full sentences.  Impression and Recommendations:   I spent 25 minutes with this patient, greater than 50% was face-to-face time counseling regarding the above diagnoses

## 2015-05-03 NOTE — Assessment & Plan Note (Addendum)
Excellent weight loss on Saxenda, she is also taking phendimetrazine from a different provider, I am going to switch this to an extended release formulation. Return to see me in one month, she did have some nausea but tells me she accidentally went up to quickly on the up titration. She will be on 2.4 mg for another week and then increase to 3 mg. Return in one month. Congratulated on weight loss.

## 2015-05-15 ENCOUNTER — Telehealth: Payer: Self-pay | Admitting: Sports Medicine

## 2015-05-15 NOTE — Telephone Encounter (Signed)
Received form by fax from [facility], filled it out and placed it in Dr. Lucienne Minks box for signature will fax once signed. - CF

## 2015-05-15 NOTE — Telephone Encounter (Signed)
Received fax for prior authorization on Phendimetrazine ER 105 mg capsules sent through cover my meds waiting on authorization. - CF

## 2015-05-16 NOTE — Telephone Encounter (Signed)
Dr. Benjamin Stain signed form and Bjorn Loser faxed it to [facility] on 05/16/2015 now waiting on authorization. - CF

## 2015-05-17 ENCOUNTER — Other Ambulatory Visit: Payer: Self-pay | Admitting: Sports Medicine

## 2015-05-24 NOTE — Telephone Encounter (Signed)
I called [employer] to check on prior authorization and the medication was denied due to being used together with another weight loss medication. - CF

## 2015-05-31 ENCOUNTER — Ambulatory Visit: Payer: Self-pay | Admitting: Sports Medicine

## 2015-06-26 ENCOUNTER — Telehealth: Payer: Self-pay | Admitting: Sports Medicine

## 2015-06-26 DIAGNOSIS — F320 Major depressive disorder, single episode, mild: Secondary | ICD-10-CM

## 2015-06-26 MED ORDER — DULOXETINE HCL 60 MG PO CPEP: 60.0000 mg | ORAL_CAPSULE | Freq: Every day | ORAL | Status: DC

## 2015-06-26 NOTE — Telephone Encounter (Signed)
Pt called. She wants refill on Cymbalta.  Thank you.

## 2015-06-26 NOTE — Telephone Encounter (Signed)
Medication refilled, ninety-day supply, she has been discharged from the practice due to multiple no shows.

## 2016-05-19 DIAGNOSIS — G43909 Migraine, unspecified, not intractable, without status migrainosus: Secondary | ICD-10-CM | POA: Insufficient documentation

## 2016-11-04 ENCOUNTER — Ambulatory Visit: Payer: Self-pay | Admitting: Physician Assistant

## 2016-11-24 ENCOUNTER — Other Ambulatory Visit: Payer: Self-pay | Admitting: Sports Medicine

## 2016-11-24 MED ORDER — OSELTAMIVIR PHOSPHATE 75 MG PO CAPS: 75.0000 mg | ORAL_CAPSULE | Freq: Two times a day (BID) | ORAL | 0 refills | Status: DC

## 2016-12-03 ENCOUNTER — Encounter: Payer: Self-pay | Admitting: Physician Assistant

## 2016-12-03 ENCOUNTER — Ambulatory Visit (INDEPENDENT_AMBULATORY_CARE_PROVIDER_SITE_OTHER): Payer: Self-pay | Admitting: Physician Assistant

## 2016-12-03 VITALS — BP 135/80 | HR 81 | Ht 63.0 in | Wt 206.0 lb

## 2016-12-03 DIAGNOSIS — F3281 Premenstrual dysphoric disorder: Secondary | ICD-10-CM

## 2016-12-03 DIAGNOSIS — N939 Abnormal uterine and vaginal bleeding, unspecified: Secondary | ICD-10-CM

## 2016-12-03 DIAGNOSIS — Z0000 Encounter for general adult medical examination without abnormal findings: Secondary | ICD-10-CM

## 2016-12-03 DIAGNOSIS — Z13220 Encounter for screening for lipoid disorders: Secondary | ICD-10-CM

## 2016-12-03 DIAGNOSIS — Z131 Encounter for screening for diabetes mellitus: Secondary | ICD-10-CM

## 2016-12-03 LAB — LIPID PANEL
CHOLESTEROL: 115 mg/dL (ref <200)
HDL: 49 mg/dL — ABNORMAL LOW (ref >50)
LDL Cholesterol: 48 mg/dL (ref <100)
Total CHOL/HDL Ratio: 2.3 Ratio (ref <5.0)
Triglycerides: 88 mg/dL (ref <150)
VLDL: 18 mg/dL (ref <30)

## 2016-12-03 LAB — COMPLETE METABOLIC PANEL WITH GFR
ALT: 19 U/L (ref 6–29)
AST: 17 U/L (ref 10–30)
Albumin: 4.3 g/dL (ref 3.6–5.1)
Alkaline Phosphatase: 74 U/L (ref 33–115)
BUN: 9 mg/dL (ref 7–25)
CALCIUM: 9.2 mg/dL (ref 8.6–10.2)
CO2: 26 mmol/L (ref 20–31)
CREATININE: 0.72 mg/dL (ref 0.50–1.10)
Chloride: 103 mmol/L (ref 98–110)
GFR, Est African American: >89 mL/min (ref >=60)
GFR, Est Non African American: >89 mL/min (ref >=60)
Glucose, Bld: 78 mg/dL (ref 65–99)
Potassium: 4.4 mmol/L (ref 3.5–5.3)
Sodium: 137 mmol/L (ref 135–146)
Total Bilirubin: 0.7 mg/dL (ref 0.2–1.2)
Total Protein: 6.6 g/dL (ref 6.1–8.1)

## 2016-12-03 NOTE — Patient Instructions (Signed)

## 2016-12-04 DIAGNOSIS — F3281 Premenstrual dysphoric disorder: Secondary | ICD-10-CM | POA: Insufficient documentation

## 2016-12-04 LAB — FSH/LH
FSH: 2.8 m[IU]/mL
LH: 4 m[IU]/mL

## 2016-12-04 LAB — TSH: TSH: 1.18 m[IU]/L

## 2016-12-04 MED ORDER — MEDROXYPROGESTERONE ACETATE 5 MG PO TABS: 5.0000 mg | ORAL_TABLET | Freq: Every day | ORAL | 0 refills | Status: DC

## 2016-12-04 NOTE — Progress Notes (Signed)
Subjective:    Patient ID: Michelle Berger, female    DOB: [birth year], 31 y.o.   MRN: [id number]  HPI Pt presents to the clinic for a check up. She recently got her pap smear.   Pt has had some unpredictable periods and heavier than normal bleeds. No cramping or pain. She is getting married at end of month and would like to NOT be on period.   .. Active Ambulatory Problems    Diagnosis Date Noted  . Major depression 07/12/2013  . Obesity 07/12/2013  . Preventive measure 07/12/2013  . Allergic rhinitis 07/12/2013  . History of migraine headaches 01/04/2015  . Tendinitis, de Quervain's 01/04/2015   Resolved Ambulatory Problems    Diagnosis Date Noted  . Acute maxillary sinusitis 11/17/2013   Past Medical History:  Diagnosis Date  . Headache    .[name].No family history on file. .. Social History   Social History  . Marital status: Single    Spouse name: N/A  . Number of children: N/A  . Years of education: N/A   Occupational History  . Not on file.   Social History Main Topics  . Smoking status: Never Smoker  . Smokeless tobacco: Never Used  . Alcohol use 3.6 oz/week    6 Glasses of wine per week  . Drug use: No  . Sexual activity: Not on file   Other Topics Concern  . Not on file   Social History Narrative  . No narrative on file    Review of Systems  All other systems reviewed and are negative.      Objective:   Physical Exam  Constitutional: She is oriented to person, place, and time. She appears well-developed and well-nourished.  HENT:  Head: Normocephalic and atraumatic.  Right Ear: External ear normal.  Left Ear: External ear normal.  Nose: Nose normal.  Mouth/Throat: Oropharynx is clear and moist. No oropharyngeal exudate.  TM's clear.   Eyes: Conjunctivae and EOM are normal. Pupils are equal, round, and reactive to light. Right eye exhibits no discharge. Left eye exhibits no discharge. No scleral icterus.  Neck: Normal range of motion. Neck  supple. No thyromegaly present.  Cardiovascular: Normal rate, regular rhythm and normal heart sounds.   Pulmonary/Chest: Effort normal and breath sounds normal. She has no wheezes.  Abdominal: Soft. Bowel sounds are normal. She exhibits no distension and no mass. There is no tenderness. There is no rebound and no guarding.  Musculoskeletal: Normal range of motion.  Lymphadenopathy:    She has no cervical adenopathy.  Neurological: She is alert and oriented to person, place, and time. No cranial nerve deficit.  Psychiatric: She has a normal mood and affect. Her behavior is normal.          Assessment & Plan:  [name].[name].Diagnoses and all orders for this visit:  Routine physical examination -     Lipid panel -     COMPLETE METABOLIC PANEL WITH GFR -     TSH  Screening for lipid disorders -     Lipid panel  Screening for diabetes mellitus -     COMPLETE METABOLIC PANEL WITH GFR  PMDD (premenstrual dysphoric disorder) -     FSH/LH -     Estrogens, total -     medroxyPROGESTERone (PROVERA) 5 MG tablet; Take 1 tablet (5 mg total) by mouth daily. Start day 18 of your monthly cycle.  Abnormal uterine bleeding -     medroxyPROGESTERone (PROVERA) 5 MG tablet; Take 1 tablet (  5 mg total) by mouth daily. Start day 18 of your monthly cycle.   Labs ordered.  Due to mood changes around cycle at request of patient ordered hormones.  Pt is on zoloft. Discussed this can help with this. GYN prescribed discussed increase in dose. She would like to hold off for now.  Pap up to date.  Provera sent for next cycle. Start day 18 take for 10 days.

## 2016-12-05 NOTE — Progress Notes (Signed)
Call pt: hormones all within range. Estrogen still pending.  Cholesterol, kidney, liver, glucose all look great.

## 2016-12-07 LAB — ESTROGENS, TOTAL: Estrogen: 330.1 pg/mL

## 2016-12-16 ENCOUNTER — Telehealth: Payer: Self-pay | Admitting: Physician Assistant

## 2016-12-16 ENCOUNTER — Other Ambulatory Visit: Payer: Self-pay | Admitting: Physician Assistant

## 2016-12-16 MED ORDER — SERTRALINE HCL 100 MG PO TABS: 100.0000 mg | ORAL_TABLET | Freq: Every day | ORAL | 5 refills | Status: DC

## 2016-12-16 NOTE — Telephone Encounter (Signed)
Pt left VM stating Jade advised she would write her for an increased dose of her Zoloft. Pt states Rx is to be for 100mg . Rx not on med list but is in last OV note, routing for review.

## 2016-12-16 NOTE — Telephone Encounter (Signed)
Ok I sent to pharmacy for 6 months.

## 2016-12-17 NOTE — Telephone Encounter (Signed)
Pt advised.

## 2017-01-26 ENCOUNTER — Encounter: Payer: Self-pay | Admitting: Physician Assistant

## 2017-01-26 ENCOUNTER — Ambulatory Visit (INDEPENDENT_AMBULATORY_CARE_PROVIDER_SITE_OTHER): Payer: Self-pay | Admitting: Physician Assistant

## 2017-01-26 VITALS — BP 122/81 | HR 96 | Ht 63.0 in | Wt 216.0 lb

## 2017-01-26 DIAGNOSIS — L723 Sebaceous cyst: Secondary | ICD-10-CM

## 2017-01-26 DIAGNOSIS — R41840 Attention and concentration deficit: Secondary | ICD-10-CM

## 2017-01-26 DIAGNOSIS — F331 Major depressive disorder, recurrent, moderate: Secondary | ICD-10-CM

## 2017-01-26 DIAGNOSIS — R5383 Other fatigue: Secondary | ICD-10-CM

## 2017-01-26 MED ORDER — DOXYCYCLINE HYCLATE 100 MG PO TABS: 100.0000 mg | ORAL_TABLET | Freq: Two times a day (BID) | ORAL | 0 refills | Status: DC

## 2017-01-26 MED ORDER — BUPROPION HCL ER (XL) 150 MG PO TB24: 150.0000 mg | ORAL_TABLET | Freq: Every day | ORAL | 1 refills | Status: DC

## 2017-01-26 NOTE — Patient Instructions (Signed)
Epidermal Cyst  An epidermal cyst is sometimes called an epidermal inclusion cyst or an infundibular cyst. It is a sac made of skin tissue. The sac contains a substance called keratin. Keratin is a protein that is normally secreted through the hair follicles. When keratin becomes trapped in the top layer of skin (epidermis), it can form an epidermal cyst.  Epidermal cysts are usually found on the face, neck, trunk, and genitals. These cysts are usually harmless (benign), and they may not cause symptoms unless they become infected. It is important not to pop epidermal cysts yourself.  What are the causes?  This condition may be caused by:  · A blocked hair follicle.  · A hair that curls and re-enters the skin instead of growing straight out of the skin (ingrown hair).  · A blocked pore.  · Irritated skin.  · An injury to the skin.  · Certain conditions that are passed along from parent to child (inherited).  · Human papillomavirus (HPV).    What increases the risk?  The following factors may make you more likely to develop an epidermal cyst:  · Having acne.  · Being overweight.  · Wearing tight clothing.    What are the signs or symptoms?  The only symptom of this condition may be a small, painless lump underneath the skin. When an epidermal cyst becomes infected, symptoms may include:  · Redness.  · Inflammation.  · Tenderness.  · Warmth.  · Fever.  · Keratin draining from the cyst. Keratin may look like a grayish-white, bad-smelling substance.  · Pus draining from the cyst.    How is this diagnosed?  This condition is diagnosed with a physical exam. In some cases, you may have a sample of tissue (biopsy) taken from your cyst to be examined under a microscope or tested for bacteria. You may be referred to a health care provider who specializes in skin care (dermatologist).  How is this treated?  In many cases, epidermal cysts go away on their own without treatment. If a cyst becomes infected, treatment may  include:  · Opening and draining the cyst. After draining, minor surgery to remove the rest of the cyst may be done.  · Antibiotic medicine to help prevent infection.  · Injections of medicines (steroids) that help to reduce inflammation.  · Surgery to remove the cyst. Surgery may be done if:  ? The cyst becomes large.  ? The cyst bothers you.  ? There is a chance that the cyst could turn into cancer.    Follow these instructions at home:  · Take over-the-counter and prescription medicines only as told by your health care provider.  · If you were prescribed an antibiotic, use it as told by your health care provider. Do not stop using the antibiotic even if you start to feel better.  · Keep the area around your cyst clean and dry.  · Wear loose, dry clothing.  · Do not try to pop your cyst.  · Avoid touching your cyst.  · Check your cyst every day for signs of infection.  · Keep all follow-up visits as told by your health care provider. This is important.  How is this prevented?  · Wear clean, dry, clothing.  · Avoid wearing tight clothing.  · Keep your skin clean and dry. Shower or take baths every day.  · Wash your body with a benzoyl peroxide wash when you shower or bathe.  Contact a health care provider   if:  · Your cyst develops symptoms of infection.  · Your condition is not improving or is getting worse.  · You develop a cyst that looks different from other cysts you have had.  · You have a fever.  Get help right away if:  · Redness spreads from the cyst into the surrounding area.  This information is not intended to replace advice given to you by your health care provider. Make sure you discuss any questions you have with your health care provider.  Document Released: 09/06/2004 Document Revised: 06/04/2016 Document Reviewed: 08/08/2015  Elsevier Interactive Patient Education © 2017 [employer]

## 2017-01-26 NOTE — Progress Notes (Signed)
Subjective:    Patient ID: Michelle Berger, female    DOB: 05-30-1986, 31 y.o.   MRN: [id number]  HPI Pt is a 31 yo female who presents to the clinic to discuss zoloft. She was put on zoloft for PMDD symptoms. She does feels like it helps some but she continues to complain of tiredness. She feels like zoloft is making her more tired. She sleeps well and only feels rested for first hour or so and then can sleep again. She is working out 3-4 times a week. She feels like she is struggling with attention as well. She has a hx of ADD in high school but parents never wanted her on medications. She has trouble completing tasks. She wonders if she would respond to medication for focus. She reports she felt the most "clear headed" while on phentermine for weight loss.   She also complains of irritation behind right ear. Hx of surgery to pin back ears. For the last month noticing some drainage from behind right ear. She feels a bump and tender at times. No fever, chills, body aches.   Review of Systems See HPI>     Objective:   Physical Exam  Constitutional: She is oriented to person, place, and time. She appears well-developed and well-nourished.  HENT:  Head: Normocephalic and atraumatic.  Cardiovascular: Normal rate, regular rhythm and normal heart sounds.   Pulmonary/Chest: Effort normal and breath sounds normal.  Neurological: She is alert and oriented to person, place, and time.  Skin:  Behind right ear in a fold of skin from ear surgery there is a sebaceous cyst not actively draining. Does appear to have erythema and some swelling around it. Slightly warm to touch.   Psychiatric: She has a normal mood and affect. Her behavior is normal.          Assessment & Plan:  Marland KitchenMarland KitchenDiagnoses and all orders for this visit:  No energy -     buPROPion (WELLBUTRIN XL) 150 MG 24 hr tablet; Take 1 tablet (150 mg total) by mouth daily.  Sebaceous cyst -     doxycycline (VIBRA-TABS) 100 MG tablet; Take  1 tablet (100 mg total) by mouth 2 (two) times daily.  Inattention -     buPROPion (WELLBUTRIN XL) 150 MG 24 hr tablet; Take 1 tablet (150 mg total) by mouth daily. -     Ambulatory referral to Psychology  Moderate episode of recurrent major depressive disorder (HCC) -     buPROPion (WELLBUTRIN XL) 150 MG 24 hr tablet; Take 1 tablet (150 mg total) by mouth daily.      . 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 I do think there is some depression component as well. Will start on wellbutrin. Cut zoloft in half to . Discussed side effects. Follow up in 4-6 weeks.   seb cyst appears infected. Treated with doxycycline and encouraged regular warm compresses. After warm compresses do some manipulation to see if can express contents of sac.  It likely will need to be removed by excision. Will send to dermatology due to location. Call back as needed.

## 2017-01-28 DIAGNOSIS — R5383 Other fatigue: Secondary | ICD-10-CM | POA: Insufficient documentation

## 2017-01-28 DIAGNOSIS — L723 Sebaceous cyst: Secondary | ICD-10-CM | POA: Insufficient documentation

## 2017-01-28 DIAGNOSIS — R41840 Attention and concentration deficit: Secondary | ICD-10-CM | POA: Insufficient documentation

## 2017-04-13 ENCOUNTER — Other Ambulatory Visit: Payer: Self-pay | Admitting: Physician Assistant

## 2017-04-13 DIAGNOSIS — R5383 Other fatigue: Secondary | ICD-10-CM

## 2017-04-13 DIAGNOSIS — F331 Major depressive disorder, recurrent, moderate: Secondary | ICD-10-CM

## 2017-04-13 DIAGNOSIS — R41840 Attention and concentration deficit: Secondary | ICD-10-CM

## 2017-04-20 ENCOUNTER — Telehealth: Payer: Self-pay | Admitting: *Deleted

## 2017-04-20 NOTE — Telephone Encounter (Signed)
Pt called stating [place] the beach [place] 2 [place] and is requesting a pill for yeast infection. She believes that it is from being in her bathing suit for long periods of time. Asked if she tried any OTC medication she reports that she tried the AZO med for yeast plus. She stated that she is experiencing a   white d/c, burning and cracking. Denies any abdominal or flank pain, fevers, sweats or chills.   Pt would like a prescription for diflucan to be sent to her pharmacy to help clear this up. Will fwd to Dr. Linford ArnoldMetheney for advice.Loralee PacasBarkley, Michelle Juhasz AvistonLynetta

## 2017-04-27 ENCOUNTER — Ambulatory Visit: Payer: Self-pay | Admitting: Physician Assistant

## 2017-04-27 DIAGNOSIS — Z0189 Encounter for other specified special examinations: Secondary | ICD-10-CM

## 2017-04-28 ENCOUNTER — Encounter: Payer: Self-pay | Admitting: Physician Assistant

## 2017-04-28 ENCOUNTER — Ambulatory Visit (INDEPENDENT_AMBULATORY_CARE_PROVIDER_SITE_OTHER): Payer: Self-pay | Admitting: Physician Assistant

## 2017-04-28 VITALS — BP 116/75 | HR 65 | Wt 221.0 lb

## 2017-04-28 DIAGNOSIS — R635 Abnormal weight gain: Secondary | ICD-10-CM

## 2017-04-28 DIAGNOSIS — T161XXD Foreign body in right ear, subsequent encounter: Secondary | ICD-10-CM

## 2017-04-28 DIAGNOSIS — R41840 Attention and concentration deficit: Secondary | ICD-10-CM

## 2017-04-28 DIAGNOSIS — B373 Candidiasis of vulva and vagina: Secondary | ICD-10-CM

## 2017-04-28 DIAGNOSIS — R5383 Other fatigue: Secondary | ICD-10-CM

## 2017-04-28 DIAGNOSIS — F3341 Major depressive disorder, recurrent, in partial remission: Secondary | ICD-10-CM

## 2017-04-28 DIAGNOSIS — B3731 Acute candidiasis of vulva and vagina: Secondary | ICD-10-CM

## 2017-04-28 MED ORDER — PHENTERMINE HCL 37.5 MG PO TABS: ORAL_TABLET | ORAL | 0 refills | Status: DC

## 2017-04-28 MED ORDER — SERTRALINE HCL 25 MG PO TABS: 25.0000 mg | ORAL_TABLET | Freq: Every day | ORAL | 5 refills | Status: DC

## 2017-04-28 MED ORDER — FLUCONAZOLE 150 MG PO TABS: 150.0000 mg | ORAL_TABLET | Freq: Once | ORAL | 0 refills | Status: AC

## 2017-04-28 MED ORDER — BUPROPION HCL ER (XL) 300 MG PO TB24: 300.0000 mg | ORAL_TABLET | Freq: Every day | ORAL | 5 refills | Status: DC

## 2017-04-28 NOTE — Patient Instructions (Addendum)
-   Decrease Zoloft to 25mg  at bedtime - Increase Wellbutrin to 300mg  every morning - Wait 2 weeks before starting Phentermine. Take Phentermine first thing in the morning. Return in 4 weeks for nurse visit weight check - Continue Weight Watchers - Continue regular cardiovascular exercise at least 30 days per week for 30 minutes   - Belviq - Contrave - Qsymia

## 2017-04-28 NOTE — Progress Notes (Signed)
HPI:                                                                Michelle Berger is a 31 y.o. female [facility] today for depression follow-up  Depression: taking Wellbutrin and Zoloft without difficulty. Reports mood is improved. Denies symptoms of mania/hypomania. Denies suicidal thinking. Denies auditory/visual hallucinations.  Weight gain: patient reports a 60 pound weight gain over 1 year. She recently joined [employer] and is working with a Systems analystpersonal trainer. Cites lack of self-control with food as major obstacle to weight loss. She has gained 5 pounds since her last visit. She is interested in Phentermine.  Ear pain: patient reports recurrent right external ear pain at the posterior auricle. She states she had her ears pinned approx. 9 years ago. Ear intermittently becomes tender and red with a palpable lump. Denies fever, chills, drainage.  Past Medical History:  Diagnosis Date  . Headache    Past Surgical History:  Procedure Laterality Date  . LIPOSUCTION MULTIPLE BODY PARTS N/A    abdomen and back  . tummy tuck     Social History  Substance Use Topics  . Smoking status: Never Smoker  . Smokeless tobacco: Never Used  . Alcohol use 3.6 oz/week    6 Glasses of wine per week   family history is not on file.  ROS: negative except as noted in the HPI  Medications: Current Outpatient Prescriptions  Medication Sig Dispense Refill  . buPROPion (WELLBUTRIN XL) 150 MG 24 hr tablet Take 1 tablet (150 mg total) by mouth daily. NEEDS APPOINTMENT FOR FUTURE REFILLS 15 tablet 0  . ondansetron (ZOFRAN-ODT) 8 MG disintegrating tablet Take 1 tablet (8 mg total) by mouth every 8 (eight) hours as needed for nausea. 20 tablet 3  . sertraline (ZOLOFT) 100 MG tablet Take 1 tablet (100 mg total) by mouth daily. 30 tablet 5   No current facility-administered medications for this visit.    Allergies  Allergen  Reactions  . Sulfur        Objective:  BP 116/75   Pulse 65   Wt 221 lb (100.2 kg)   BMI 39.15 kg/m  Gen: well-groomed, cooperative, not ill-appearing, no distress HEENT: normal conjunctiva, posterior auricle of right ear with palpable, white foreign body, auricle is tender, trachea midline Pulm: Normal work of breathing, normal phonation, clear to auscultation bilaterally CV: Normal rate, regular rhythm, s1 and s2 distinct, no murmurs, clicks or rubs  Neuro: alert and oriented x 3, EOM's intact, no tremor MSK: moving all extremities, normal gait and station, no peripheral edema Skin: warm, dry, intact; no rashes or lesions on exposed skin Psych: good eye contact, euthymic mood, affect mood-congruent, normal speech, normal thought content  Depression screen [facility] 2/9 04/28/2017 01/26/2017  Decreased Interest 0 2  Down, Depressed, Hopeless 0 2  PHQ - 2 Score 0 4  Altered sleeping 2 3  Tired, decreased energy 2 3  Change in appetite 0 2  Feeling bad or failure about yourself  0 0  Trouble concentrating 2 3  Moving slowly or fidgety/restless 0 2  Suicidal thoughts 0 0  PHQ-9 Score 6 17    No results found for this or any  previous visit (from the past 72 hour(s)). No results found.    Assessment and Plan: 31 y.o. female with   1. Inattention - cont Wellbutrin XL 300mg  QAM  2. No energy - cont Wellbutrin XL 300mg  QAM - encouraged regular cardiovascular exercise  3. 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 - buPROPion (WELLBUTRIN XL) 300 MG 24 hr tablet; Take 1 tablet (300 mg total) by mouth daily.  Dispense: 30 tablet; Refill: 5 - sertraline (ZOLOFT) 25 MG tablet; Take 1 tablet (25 mg total) by mouth daily.  Dispense: 30 tablet; Refill: 5  4. Recurrent candidiasis of vagina - fluconazole (DIFLUCAN) 150 MG  tablet; Take 1 tablet (150 mg total) by mouth once.  Dispense: 1 tablet; Refill: 0  5. Abnormal weight gain - phentermine (ADIPEX-P) 37.5 MG tablet; One tab by mouth qAM  Dispense: 30 tablet; Refill: 0 - Continue Weight Watchers - Continue regular cardiovascular exercise at least 30 days per week for 30 minutes  6. Foreign body in auricle of right ear - appears to be a suture that did not resorb from prior surgery - follow-up with Dr. Benjamin Stain for excision  Patient education and anticipatory guidance given Patient agrees with treatment plan Follow-up in 4 weeks for weight check or sooner as needed  Levonne Hubert PA-C

## 2017-05-05 DIAGNOSIS — R635 Abnormal weight gain: Secondary | ICD-10-CM | POA: Insufficient documentation

## 2017-05-05 DIAGNOSIS — B3731 Acute candidiasis of vulva and vagina: Secondary | ICD-10-CM | POA: Insufficient documentation

## 2017-05-05 DIAGNOSIS — B373 Candidiasis of vulva and vagina: Secondary | ICD-10-CM | POA: Insufficient documentation

## 2017-05-05 DIAGNOSIS — T161XXA Foreign body in right ear, initial encounter: Secondary | ICD-10-CM | POA: Insufficient documentation

## 2017-05-15 ENCOUNTER — Encounter: Payer: Self-pay | Admitting: Sports Medicine

## 2017-05-15 ENCOUNTER — Ambulatory Visit (INDEPENDENT_AMBULATORY_CARE_PROVIDER_SITE_OTHER): Payer: Self-pay | Admitting: Sports Medicine

## 2017-05-15 DIAGNOSIS — T161XXD Foreign body in right ear, subsequent encounter: Secondary | ICD-10-CM

## 2017-05-15 NOTE — Assessment & Plan Note (Signed)
History of an ear pinning years ago, there has been some break through of the wound with suture visible. The suture was causing significant amounts of pain, as well as purulence, bleeding, infection. The extra suture was clipped any bleeding was controlled with electrocautery. Return to see me in one week for a wound check.

## 2017-05-15 NOTE — Progress Notes (Signed)
   Subjective:    I'm seeing this patient as a consultation for:  Michelle GawJade Breeback, PA-C.  Gena Frayharley Cummings, PA-C  CC: Right ear issue  HPI: Years ago this pleasant 31 year old female had "ear pinning" surgery.  She did well until more recently when she started to notice something protruding from her skin behind her right ear.  This started to hurt and she presented several times with swelling and pain.  Ultimately a few courses of antibiotics would relieve her pain, but it would always come back.  She was referred to be for further evaluation and treatment.  Symptoms are moderate and worsening.  Past medical history, Surgical history, Family history not pertinant except as noted below, Social history, Allergies, and medications have been entered into the medical record, reviewed, and no changes needed.   Review of Systems: No headache, visual changes, nausea, vomiting, diarrhea, constipation, dizziness, abdominal pain, skin rash, fevers, chills, night sweats, weight loss, swollen lymph nodes, body aches, joint swelling, muscle aches, chest pain, shortness of breath, mood changes, visual or auditory hallucinations.   Objective:   General: Well Developed, well nourished, and in no acute distress.  Neuro:  Extra-ocular muscles intact, able to move all 4 extremities, sensation grossly intact.  Deep tendon reflexes tested were normal. Psych: Alert and oriented, mood congruent with affect. ENT:  Ears and nose appear unremarkable.  Hearing grossly normal. Neck: Unremarkable overall appearance, trachea midline.  No visible thyroid enlargement. Eyes: Conjunctivae and lids appear unremarkable.  Pupils equal and round. Skin: Warm and dry, no rashes noted.  Cardiovascular: Pulses palpable, no extremity edema. Ears:  There is protruding anchoring suture knot from her ear pinning surgery.    Procedure: Removal of suture knot foreign body from behind right ear Risks, benefits, and alternatives explained and  consent obtained. Time out conducted. Surface prepped with alcohol. 1cc lidocaine with epinephine infiltrated in a field block. Adequate anesthesia ensured. Area prepped and draped in a sterile fashion. Excision performed with: I clipped the foreign body/suture knot flush with the skin, then used hyfrecation to achieve hemostasis. Ear maintains its position. Pt stable.  Impression and Recommendations:   This case required medical decision making of moderate complexity.  Foreign body in auricle of right ear History of an ear pinning years ago, there has been some break through of the wound with suture visible. The suture was causing significant amounts of pain, as well as purulence, bleeding, infection. The extra suture was clipped any bleeding was controlled with electrocautery. Return to see me in one week for a wound check.

## 2017-05-22 ENCOUNTER — Encounter: Payer: Self-pay | Admitting: Sports Medicine

## 2017-05-22 ENCOUNTER — Ambulatory Visit (INDEPENDENT_AMBULATORY_CARE_PROVIDER_SITE_OTHER): Payer: Self-pay | Admitting: Sports Medicine

## 2017-05-22 DIAGNOSIS — T161XXD Foreign body in right ear, subsequent encounter: Secondary | ICD-10-CM

## 2017-05-22 NOTE — Progress Notes (Addendum)
  Subjective: Patient returns, 1 week post removal of a simple foreign body/protruding suture material from ear pinning surgery decades ago, doing well, completely asymptomatic and pain-free now. This is the best her ears have felt in 6 months.   Objective: General: Well-developed, well-nourished, and in no acute distress. Right ear: A bit of scab was removed, skin is clean, dry, intact.  Assessment/plan:   Foreign body in auricle of right ear 1 week post removal of a simple foreign body/protruding suture material from ear pinning surgery decades ago, doing well, completely asymptomatic and pain-free now. This is the best her ears have felt in 6 months.

## 2017-05-22 NOTE — Assessment & Plan Note (Addendum)
1 week post removal of a simple foreign body/protruding suture material from ear pinning surgery decades ago, doing well, completely asymptomatic and pain-free now. This is the best her ears have felt in 6 months.

## 2017-11-18 ENCOUNTER — Ambulatory Visit (INDEPENDENT_AMBULATORY_CARE_PROVIDER_SITE_OTHER): Payer: Self-pay | Admitting: Physician Assistant

## 2017-11-18 ENCOUNTER — Encounter: Payer: Self-pay | Admitting: Physician Assistant

## 2017-11-18 VITALS — BP 125/65 | HR 92 | Ht 63.0 in | Wt 218.0 lb

## 2017-11-18 DIAGNOSIS — T753XXA Motion sickness, initial encounter: Secondary | ICD-10-CM | POA: Insufficient documentation

## 2017-11-18 DIAGNOSIS — E6609 Other obesity due to excess calories: Secondary | ICD-10-CM

## 2017-11-18 DIAGNOSIS — F3341 Major depressive disorder, recurrent, in partial remission: Secondary | ICD-10-CM

## 2017-11-18 DIAGNOSIS — T753XXD Motion sickness, subsequent encounter: Secondary | ICD-10-CM

## 2017-11-18 DIAGNOSIS — Z6838 Body mass index (BMI) 38.0-38.9, adult: Secondary | ICD-10-CM

## 2017-11-18 DIAGNOSIS — Z79899 Other long term (current) drug therapy: Secondary | ICD-10-CM

## 2017-11-18 MED ORDER — SERTRALINE HCL 25 MG PO TABS: 25.0000 mg | ORAL_TABLET | Freq: Every day | ORAL | 3 refills | Status: DC

## 2017-11-18 MED ORDER — PROMETHAZINE HCL 25 MG PO TABS: 25.0000 mg | ORAL_TABLET | Freq: Four times a day (QID) | ORAL | 1 refills | Status: DC | PRN

## 2017-11-18 MED ORDER — BUPROPION HCL ER (XL) 300 MG PO TB24: 300.0000 mg | ORAL_TABLET | Freq: Every day | ORAL | 3 refills | Status: DC

## 2017-11-18 NOTE — Progress Notes (Addendum)
Subjective:    Patient ID: Michelle Berger, female    DOB: 1986-03-14, 32 y.o.   MRN: [id number]  HPI  Pt is a 32 year old female with a history of MDD who is presenting to the clinic for management of her medications. She is requesting refills on her Wellbutrin and Zoloft. She is happy with their efficacy and would like to stay at the current doses. She reports recently she has had some  Fatigue, anxiety, and feels on edge due to some stresses in the new year like taxes and work. She has had increasing number of headaches that she attributes to this recent increase in stress. During these headache episodes she takes ibuprofen when they are more severe which helps with the symptoms. She has also started phentermine 3 weeks ago via a prescription from a weight loss clinic in [place]. She is having a breast reduction at the end of February and was asking if there is anything she needs to do for this medically. Additionally she requested Phenergan for motion sickness for some upcoming trips and motion sickness.    Review of Systems  Constitutional: Positive for fatigue. Negative for chills and fever.  Respiratory: Negative for shortness of breath.   Cardiovascular: Negative for chest pain and palpitations.  Psychiatric/Behavioral: Negative for self-injury, sleep disturbance and suicidal ideas.       Objective:   Physical Exam  Constitutional: She is oriented to person, place, and time. She appears well-developed and well-nourished. No distress.  HENT:  Head: Normocephalic and atraumatic.  Cardiovascular: Normal rate, regular rhythm and normal heart sounds.  Pulmonary/Chest: Effort normal and breath sounds normal.  Neurological: She is alert and oriented to person, place, and time.  Psychiatric: She has a normal mood and affect.   Vitals:   11/18/17 1350  BP: 125/65  Pulse: 92    Depression screen [facility] 2/9 11/18/2017 04/28/2017 01/26/2017  Decreased Interest 2 0 2  Down, Depressed,  Hopeless 0 0 2  PHQ - 2 Score 2 0 4  Altered sleeping 0 2 3  Tired, decreased energy 3 2 3   Change in appetite 0 0 2  Feeling bad or failure about yourself  0 0 0  Trouble concentrating 3 2 3   Moving slowly or fidgety/restless 0 0 2  Suicidal thoughts 0 0 0  PHQ-9 Score 8 6 17    GAD 7 : Generalized Anxiety Score 11/18/2017  Nervous, Anxious, on Edge 1  Control/stop worrying 0  Worry too much - different things 0  Trouble relaxing 1  Restless 0  Easily annoyed or irritable 1  Afraid - awful might happen 1  Total GAD 7 Score 4       Assessment & Plan:   Michelle Berger was seen today for depression.  Diagnoses and all orders for this visit:  Recurrent major depressive disorder, in partial remission (HCC) -     sertraline (ZOLOFT) 25 MG tablet; Take 1 tablet (25 mg total) by mouth daily. -     buPROPion (WELLBUTRIN XL) 300 MG 24 hr tablet; Take 1 tablet (300 mg total) by mouth daily.  Medication management -     COMPLETE METABOLIC PANEL WITH GFR  Motion sickness, subsequent encounter -     promethazine (PHENERGAN) 25 MG tablet; Take 1 tablet (25 mg total) by mouth every 6 (six) hours as needed for nausea or vomiting.  Class 2 obesity due to excess calories without serious comorbidity with body mass index (BMI) of 38.0 to 38.9 in  adult    Michelle Berger was screened with a PHQ9 and GAD7 for depression and anxiety monitoring. She will continue the Zoloft and Wellbutrin for managing her symptoms. She was given reassurance that some of this recent anxiety could be related to the stimulatory effects of phentermine and she was advised if needed she could try a half tab for appetite suppression. She was congratulated on her progress in weight loss and is encouraged to continue exercise & diet. She will follow up in one year for an annual visit and is due for a pap smear at that time. She has been given a script for phenergan for her upcoming trips and will also be getting a CMP today to evaluate  kidney and liver function. She was told that she should discontinue phentermine two days priors to her surgery but will likely be given more specific instructions by her surgeon. If she has any questions or concerns she can call the office or message me on myChart.

## 2017-12-01 ENCOUNTER — Other Ambulatory Visit: Payer: Self-pay

## 2017-12-01 ENCOUNTER — Emergency Department
Admission: EM | Admit: 2017-12-01 | Discharge: 2017-12-01 | Disposition: A | Discharge status: Home / Self Care | Payer: Self-pay | Source: Home / Self Care | Attending: Family Medicine | Admitting: Family Medicine

## 2017-12-01 ENCOUNTER — Emergency Department (INDEPENDENT_AMBULATORY_CARE_PROVIDER_SITE_OTHER): Payer: Self-pay

## 2017-12-01 DIAGNOSIS — B3731 Acute candidiasis of vulva and vagina: Secondary | ICD-10-CM

## 2017-12-01 DIAGNOSIS — B373 Candidiasis of vulva and vagina: Secondary | ICD-10-CM

## 2017-12-01 DIAGNOSIS — N390 Urinary tract infection, site not specified: Secondary | ICD-10-CM

## 2017-12-01 DIAGNOSIS — R05 Cough: Secondary | ICD-10-CM

## 2017-12-01 DIAGNOSIS — R509 Fever, unspecified: Secondary | ICD-10-CM

## 2017-12-01 DIAGNOSIS — J189 Pneumonia, unspecified organism: Secondary | ICD-10-CM

## 2017-12-01 LAB — POCT WET + KOH PREP: TRICH BY WET PREP: ABSENT

## 2017-12-01 LAB — POCT URINALYSIS DIP (MANUAL ENTRY)
BILIRUBIN UA: NEGATIVE
Blood, UA: NEGATIVE
Glucose, UA: NEGATIVE mg/dL
Nitrite, UA: NEGATIVE
PH UA: 6 (ref 5.0–8.0)
PROTEIN UA: NEGATIVE mg/dL
Spec Grav, UA: 1.02 (ref 1.010–1.025)
Urobilinogen, UA: 0.2 E.U./dL

## 2017-12-01 MED ORDER — CEFDINIR 300 MG PO CAPS: 300.0000 mg | ORAL_CAPSULE | Freq: Two times a day (BID) | ORAL | 0 refills | Status: DC

## 2017-12-01 MED ORDER — FLUCONAZOLE 150 MG PO TABS: ORAL_TABLET | ORAL | 0 refills | Status: DC

## 2017-12-01 MED ORDER — BENZONATATE 200 MG PO CAPS: ORAL_CAPSULE | ORAL | 0 refills | Status: DC

## 2017-12-01 NOTE — Discharge Instructions (Signed)
Take plain guaifenesin (1200mg extended release tabs such as Mucinex) twice daily, with plenty of water, for cough and congestion.  Get adequate rest.   °May use Afrin nasal spray (or generic oxymetazoline) each morning for about 5 days and then discontinue.  Also recommend using saline nasal spray several times daily and saline nasal irrigation (AYR is a common brand).  Use Flonase nasal spray each morning after using Afrin nasal spray and saline nasal irrigation. °Try warm salt water gargles for sore throat.  °Stop all antihistamines for now, and other non-prescription cough/cold preparations. °  °  °

## 2017-12-01 NOTE — ED Provider Notes (Signed)
[facility]    CSN: [id number] Arrival date & time: 12/01/17  0951     History   Chief Complaint Chief Complaint  Patient presents with  . Urinary Frequency    HPI Michelle Berger is a 32 y.o. female.   Six days ago patient developed flu-like illness including myalgias, headache, fever 101/chills, fatigue, and cough.  She denies nasal congestion and sore throat.  Cough is non-productive and somewhat worse at night.  No pleuritic pain or shortness of breath, but she feels tightness in her anterior chest.  She is concerned that she may have a yeast infection; yesterday she developed dysuria, frequency, and nocturia, but no vaginal discharge. She has a past history of sports induced asthma and seasonal rhinitis.   The history is provided by the patient.    Past Medical History:  Diagnosis Date  . Headache     Patient Active Problem List   Diagnosis Date Noted  . Motion sickness 11/18/2017  . Recurrent candidiasis of vagina 05/05/2017  . Abnormal weight gain 05/05/2017  . Foreign body in auricle of right ear 05/05/2017  . Sebaceous cyst 01/28/2017  . Inattention 01/28/2017  . No energy 01/28/2017  . PMDD (premenstrual dysphoric disorder) 12/04/2016  . History of migraine headaches 01/04/2015  . Tendinitis, de Quervain's 01/04/2015  . Major depression 07/12/2013  . Class 2 obesity due to excess calories without serious comorbidity with body mass index (BMI) of 38.0 to 38.9 in adult 07/12/2013  . Preventive measure 07/12/2013  . Allergic rhinitis 07/12/2013    Past Surgical History:  Procedure Laterality Date  . LIPOSUCTION MULTIPLE BODY PARTS N/A    abdomen and back  . tummy tuck      OB History    No data available       Home Medications    Prior to Admission medications   Medication Sig Start Date End Date Taking? Authorizing Provider  benzonatate (TESSALON) 200 MG capsule Take one cap by mouth at bedtime as needed for cough.  May repeat in  4 to 6 hours 12/01/17   Lattie Haw, MD  buPROPion (WELLBUTRIN XL) 300 MG 24 hr tablet Take 1 tablet (300 mg total) by mouth daily. 11/18/17   Breeback, Lonna Cobb, PA-C  cefdinir (OMNICEF) 300 MG capsule Take 1 capsule (300 mg total) by mouth 2 (two) times daily. 12/01/17   Lattie Haw, MD  fluconazole (DIFLUCAN) 150 MG tablet Take one tab by mouth once.  May repeat in 72 hours. 12/01/17   Lattie Haw, MD  phentermine (ADIPEX-P) 37.5 MG tablet One tab by mouth qAM 04/28/17   Carlis Stable, PA-C  promethazine (PHENERGAN) 25 MG tablet Take 1 tablet (25 mg total) by mouth every 6 (six) hours as needed for nausea or vomiting. 11/18/17   Breeback, Jade L, PA-C  sertraline (ZOLOFT) 25 MG tablet Take 1 tablet (25 mg total) by mouth daily. 11/18/17   Jomarie Longs, PA-C    Family History History reviewed. No pertinent family history.  Social History Social History   Tobacco Use  . Smoking status: Never Smoker  . Smokeless tobacco: Never Used  Substance Use Topics  . Alcohol use: Yes    Alcohol/week: 3.6 oz    Types: 6 Glasses of wine per week  . Drug use: No     Allergies   Sulfur   Review of Systems Review of Systems No sore throat + cough No pleuritic pain, but feels tight in anterior  chest No wheezing No nasal congestion ? post-nasal drainage No sinus pain/pressure No itchy/red eyes No earache No hemoptysis No SOB + fever, + chills No nausea No vomiting No abdominal pain No diarrhea + dysuria, frequency, and nocturia No vaginal discharge No skin rash + fatigue No myalgias + headache Used OTC meds without relief   Physical Exam Triage Vital Signs ED Triage Vitals [12/01/17 1042]  Enc Vitals Group     BP 126/83     Pulse Rate (!) 101     Resp      Temp 98.2 F (36.8 C)     Temp Source Oral     SpO2 99 %     Weight 217 lb (98.4 kg)     Height 5\' 4"  (1.626 m)     Head Circumference      Peak Flow      Pain Score 0     Pain Loc       Pain Edu?      Excl. in GC?    No data found.  Updated Vital Signs BP 126/83 (BP Location: Right Arm)   Pulse (!) 101   Temp 98.2 F (36.8 C) (Oral)   Ht 5\' 4"  (1.626 m)   Wt 217 lb (98.4 kg)   LMP 11/03/2017   SpO2 99%   BMI 37.25 kg/m   Visual Acuity Right Eye Distance:   Left Eye Distance:   Bilateral Distance:    Right Eye Near:   Left Eye Near:    Bilateral Near:     Physical Exam Nursing notes and Vital Signs reviewed. Appearance:  Patient appears stated age, and in no acute distress Eyes:  Pupils are equal, round, and reactive to light and accomodation.  Extraocular movement is intact.  Conjunctivae are not inflamed  Ears:  Canals normal.  Tympanic membranes normal.  Nose:  Mildly congested turbinates.  No sinus tenderness. Pharynx:  Normal Neck:  Supple.  Enlarged posterior/lateral nodes are palpated bilaterally, tender to palpation on the left.   Lungs:  Clear to auscultation.  Breath sounds are equal.  Moving air well. Heart:  Regular rate and rhythm without murmurs, rubs, or gallops. Chest:  Distinct tenderness to palpation over the mid-sternum.   Abdomen:   Mild midline tenderness over bladder without masses or hepatosplenomegaly.  Bowel sounds are present.  No CVA or flank tenderness.  Extremities:  No edema.  Skin:  No rash present.    UC Treatments / Results  Labs (all labs ordered are listed, but only abnormal results are displayed) Labs Reviewed  POCT URINALYSIS DIP (MANUAL ENTRY) - Abnormal; Notable for the following components:      Result Value   Ketones, POC UA trace (5) (*)    Leukocytes, UA Trace (*)    All other components within normal limits  POCT WET + KOH PREP - Abnormal; Notable for the following components:   Yeast by KOH Present (*)    Yeast by wet prep Present (*)    WBC by wet prep None (*)    All other components within normal limits  URINE CULTURE  Note:  Vaginal specimen self-obtained by patient  EKG  EKG  Interpretation None       Radiology Dg Chest 2 View  Result Date: 12/01/2017 CLINICAL DATA:  Cough and fever EXAM: CHEST  2 VIEW COMPARISON:  None. FINDINGS: Cardiac shadow is within normal limits. The right heart border is not well appreciated due to medial right lung base  infiltrate. No sizable effusion is noted. No bony abnormality is noted. IMPRESSION: Right basilar infiltrate. Electronically Signed   By: Alcide Clever M.D.   On: 12/01/2017 11:22    Procedures Procedures (including critical care time)  Medications Ordered in UC Medications - No data to display   Initial Impression / Assessment and Plan / UC Course  I have reviewed the triage vital signs and the nursing notes.  Pertinent labs & imaging results that were available during my care of the patient were reviewed by me and considered in my medical decision making (see chart for details).    Urine culture pending. Begin empiric Omnicef.  Prescription written for Benzonatate [facility]) to take at bedtime for night-time cough.  Rx for Diflucan. Take plain guaifenesin (1200mg  extended release tabs such as Mucinex) twice daily, with plenty of water, for cough and congestion.  Get adequate rest.   May use Afrin nasal spray (or generic oxymetazoline) each morning for about 5 days and then discontinue.  Also recommend using saline nasal spray several times daily and saline nasal irrigation (AYR is a common brand).  Use Flonase nasal spray each morning after using Afrin nasal spray and saline nasal irrigation. Try warm salt water gargles for sore throat.  Stop all antihistamines for now, and other non-prescription cough/cold preparations. Followup with Family Doctor if not improved in 10 days.    Final Clinical Impressions(s) / UC Diagnoses   Final diagnoses:  Community acquired pneumonia of right lung, unspecified part of lung  Urinary tract infection without hematuria, site unspecified  Candida vaginitis    ED  Discharge Orders        Ordered    cefdinir (OMNICEF) 300 MG capsule  2 times daily     12/01/17 1216    benzonatate (TESSALON) 200 MG capsule     12/01/17 1216    fluconazole (DIFLUCAN) 150 MG tablet     12/01/17 1216           Lattie Haw, MD 12/12/17 1252

## 2017-12-01 NOTE — ED Triage Notes (Signed)
Has felt bad since last Thursday, but thinks she may have had the flu.  Started burning when urinating yesterday.  Has a hx of yeast infections

## 2017-12-02 ENCOUNTER — Ambulatory Visit: Payer: Self-pay | Admitting: Physician Assistant

## 2017-12-02 DIAGNOSIS — Z0189 Encounter for other specified special examinations: Secondary | ICD-10-CM

## 2017-12-02 LAB — URINE CULTURE
MICRO NUMBER:: 90186769
Result:: NO GROWTH
SPECIMEN QUALITY:: ADEQUATE

## 2017-12-03 ENCOUNTER — Telehealth: Payer: Self-pay | Admitting: *Deleted

## 2017-12-03 NOTE — Telephone Encounter (Signed)
LM with Ucx results and to call back if she has any questions or concerns.  

## 2018-01-26 ENCOUNTER — Ambulatory Visit (INDEPENDENT_AMBULATORY_CARE_PROVIDER_SITE_OTHER): Payer: Self-pay | Admitting: Sports Medicine

## 2018-01-26 ENCOUNTER — Encounter: Payer: Self-pay | Admitting: Sports Medicine

## 2018-01-26 DIAGNOSIS — M722 Plantar fascial fibromatosis: Secondary | ICD-10-CM

## 2018-01-26 MED ORDER — IBUPROFEN 800 MG PO TABS: 800.0000 mg | ORAL_TABLET | Freq: Three times a day (TID) | ORAL | 2 refills | Status: DC | PRN

## 2018-01-26 MED ORDER — SCOPOLAMINE 1 MG/3DAYS TD PT72: 1.0000 | MEDICATED_PATCH | TRANSDERMAL | 3 refills | Status: DC

## 2018-01-26 NOTE — Progress Notes (Signed)
Subjective:    I'm seeing this patient as a consultation for: Tandy Gaw, PA-C  CC: Right foot pain  HPI: For the past couple of nights this pleasant 32 year old female has noted severe pain, swelling over the plantar aspect of her right foot, improved over the past day or 2.  Still present, she does have trip coming up to [place] on Thursday.  No trauma, she does tend to walk barefoot quite often.  Localized without radiation.  I reviewed the past medical history, family history, social history, surgical history, and allergies today and no changes were needed.  Please see the problem list section below in epic for further details.  Past Medical History: Past Medical History:  Diagnosis Date  . Headache    Past Surgical History: Past Surgical History:  Procedure Laterality Date  . LIPOSUCTION MULTIPLE BODY PARTS N/A    abdomen and back  . tummy tuck     Social History: Social History   Socioeconomic History  . Marital status: Single    Spouse name: Not on file  . Number of children: Not on file  . Years of education: Not on file  . Highest education level: Not on file  Occupational History  . Not on file  Social Needs  . Financial resource strain: Not on file  . Food insecurity:    Worry: Not on file    Inability: Not on file  . Transportation needs:    Medical: Not on file    Non-medical: Not on file  Tobacco Use  . Smoking status: Never Smoker  . Smokeless tobacco: Never Used  Substance and Sexual Activity  . Alcohol use: Yes    Alcohol/week: 3.6 oz    Types: 6 Glasses of wine per week  . Drug use: No  . Sexual activity: Not on file  Lifestyle  . Physical activity:    Days per week: Not on file    Minutes per session: Not on file  . Stress: Not on file  Relationships  . Social connections:    Talks on phone: Not on file    Gets together: Not on file    Attends religious service: Not on file    Active member of club or organization: Not on  file    Attends meetings of clubs or organizations: Not on file    Relationship status: Not on file  Other Topics Concern  . Not on file  Social History Narrative  . Not on file   Family History: No family history on file. Allergies: Allergies  Allergen Reactions  . Sulfur    Medications: See med rec.  Review of Systems: No headache, visual changes, nausea, vomiting, diarrhea, constipation, dizziness, abdominal pain, skin rash, fevers, chills, night sweats, weight loss, swollen lymph nodes, body aches, joint swelling, muscle aches, chest pain, shortness of breath, mood changes, visual or auditory hallucinations.   Objective:   General: Well Developed, well nourished, and in no acute distress.  Neuro:  Extra-ocular muscles intact, able to move all 4 extremities, sensation grossly intact.  Deep tendon reflexes tested were normal. Psych: Alert and oriented, mood congruent with affect. ENT:  Ears and nose appear unremarkable.  Hearing grossly normal. Neck: Unremarkable overall appearance, trachea midline.  No visible thyroid enlargement. Eyes: Conjunctivae and lids appear unremarkable.  Pupils equal and round. Skin: Warm and dry, no rashes noted.  Cardiovascular: Pulses palpable, no extremity edema. Right foot: Slight fullness over the plantar aspect of the medial heel  Range of motion is full in all directions. Strength is 5/5 in all directions. No hallux valgus. No pes cavus or pes planus. No abnormal callus noted. No pain over the navicular prominence, or base of fifth metatarsal. Moderate tenderness to palpation of the calcaneal insertion of plantar fascia. No pain at the Achilles insertion. No pain over the calcaneal bursa. No pain of the retrocalcaneal bursa. No tenderness to palpation over the tarsals, metatarsals, or phalanges. No hallux rigidus or limitus. No tenderness palpation over interphalangeal joints. No pain with compression of the metatarsal  heads. Neurovascularly intact distally.  Impression and Recommendations:   This case required medical decision making of moderate complexity.  Plantar fasciitis, right Avoid barefoot walking, adding prescription strength ibuprofen, gelcaps, rehab exercises. She does have a trip coming up to [place] at the end of the week. Return to see me in a month. ___________________________________________ Ihor Austinhomas J. Benjamin Stainhekkekandam, M.D., ABFM., CAQSM. Primary Care and Sports Medicine [facility]  Adjunct Instructor of Family Medicine  [employer]

## 2018-01-26 NOTE — Assessment & Plan Note (Signed)
Avoid barefoot walking, adding prescription strength ibuprofen, gelcaps, rehab exercises. She does have a trip coming up to [place] at the end of the week. Return to see me in a month.

## 2018-05-17 ENCOUNTER — Emergency Department
Admission: EM | Admit: 2018-05-17 | Discharge: 2018-05-17 | Disposition: A | Discharge status: Home / Self Care | Payer: Self-pay | Source: Home / Self Care

## 2018-05-17 ENCOUNTER — Other Ambulatory Visit: Payer: Self-pay | Admitting: Physician Assistant

## 2018-05-17 ENCOUNTER — Encounter: Payer: Self-pay | Admitting: Emergency Medicine

## 2018-05-17 ENCOUNTER — Other Ambulatory Visit: Payer: Self-pay

## 2018-05-17 DIAGNOSIS — L03115 Cellulitis of right lower limb: Secondary | ICD-10-CM

## 2018-05-17 DIAGNOSIS — T753XXD Motion sickness, subsequent encounter: Secondary | ICD-10-CM

## 2018-05-17 MED ORDER — FLUCONAZOLE 150 MG PO TABS: 150.0000 mg | ORAL_TABLET | Freq: Every day | ORAL | 1 refills | Status: DC

## 2018-05-17 MED ORDER — DOXYCYCLINE HYCLATE 100 MG PO CAPS: 100.0000 mg | ORAL_CAPSULE | Freq: Two times a day (BID) | ORAL | 0 refills | Status: DC

## 2018-05-17 NOTE — Discharge Instructions (Addendum)
Return if symptoms worsen or change  °

## 2018-05-17 NOTE — ED Triage Notes (Signed)
32 y.o female presents with pain to the right knee x1wk. States it is becoming more painful and the redness is increasing. She denies drainage. She also c/o constipation and states she's only had one stool in a week which is very abnormal for her. She states she is taking vitamins and stool softeners with no relief.

## 2018-05-17 NOTE — ED Provider Notes (Addendum)
[facility]    CSN: [id number] Arrival date & time: 05/17/18  0950     History   Chief Complaint Chief Complaint  Patient presents with  . Knee Pain    right  . Constipation    HPI Michelle Berger is a 32 y.o. female.   The history is provided by the patient. No language interpreter was used.  Knee Pain  Location:  Knee Injury: no   Knee location:  R knee Pain details:    Quality:  Aching   Radiates to:  Does not radiate   Severity:  Moderate   Onset quality:  Gradual   Timing:  Constant   Progression:  Worsening Chronicity:  New Foreign body present:  No foreign bodies Prior injury to area:  No Worsened by:  Nothing Associated symptoms: swelling   Associated symptoms: no decreased ROM   Constipation   Pt complains of redness and swelling right knee Past Medical History:  Diagnosis Date  . Headache     Patient Active Problem List   Diagnosis Date Noted  . Plantar fasciitis, right 01/26/2018  . Motion sickness 11/18/2017  . Recurrent candidiasis of vagina 05/05/2017  . Abnormal weight gain 05/05/2017  . Foreign body in auricle of right ear 05/05/2017  . Sebaceous cyst 01/28/2017  . Inattention 01/28/2017  . No energy 01/28/2017  . PMDD (premenstrual dysphoric disorder) 12/04/2016  . History of migraine headaches 01/04/2015  . Tendinitis, de Quervain's 01/04/2015  . Major depression 07/12/2013  . Class 2 obesity due to excess calories without serious comorbidity with body mass index (BMI) of 38.0 to 38.9 in adult 07/12/2013  . Preventive measure 07/12/2013  . Allergic rhinitis 07/12/2013    Past Surgical History:  Procedure Laterality Date  . LIPOSUCTION MULTIPLE BODY PARTS N/A    abdomen and back  . tummy tuck      OB History   None      Home Medications    Prior to Admission medications   Medication Sig Start Date End Date Taking? Authorizing Provider  buPROPion (WELLBUTRIN XL) 300 MG 24 hr tablet Take 1 tablet (300  mg total) by mouth daily. 11/18/17   Breeback, Lonna Cobb, PA-C  doxycycline (VIBRAMYCIN) 100 MG capsule Take 1 capsule (100 mg total) by mouth 2 (two) times daily. 05/17/18   Elson Areas, PA-C  fluconazole (DIFLUCAN) 150 MG tablet Take 1 tablet (150 mg total) by mouth daily. 05/17/18   Elson Areas, PA-C  ibuprofen (ADVIL,MOTRIN) 800 MG tablet Take 1 tablet (800 mg total) by mouth every 8 (eight) hours as needed. 01/26/18   Monica Becton, MD  phentermine (ADIPEX-P) 37.5 MG tablet One tab by mouth qAM 04/28/17   Carlis Stable, PA-C  promethazine (PHENERGAN) 25 MG tablet Take 1 tablet (25 mg total) by mouth every 6 (six) hours as needed for nausea or vomiting. 11/18/17   Breeback, Jade L, PA-C  scopolamine (TRANSDERM-SCOP, 1.5 MG,) 1 MG/3DAYS Place 1 patch (1.5 mg total) onto the skin every 3 (three) days. 01/26/18   Monica Becton, MD  sertraline (ZOLOFT) 25 MG tablet Take 1 tablet (25 mg total) by mouth daily. 11/18/17   Jomarie Longs, PA-C    Family History History reviewed. No pertinent family history.  Social History Social History   Tobacco Use  . Smoking status: Never Smoker  . Smokeless tobacco: Never Used  Substance Use Topics  . Alcohol use: Yes    Alcohol/week: 3.6 oz  Types: 6 Glasses of wine per week  . Drug use: No     Allergies   Sulfur   Review of Systems Review of Systems  Gastrointestinal: Positive for constipation.  All other systems reviewed and are negative.    Physical Exam Triage Vital Signs ED Triage Vitals  Enc Vitals Group     BP 05/17/18 1028 125/81     Pulse Rate 05/17/18 1028 96     Resp --      Temp 05/17/18 1028 97.9 F (36.6 C)     Temp Source 05/17/18 1028 Oral     SpO2 05/17/18 1028 99 %     Weight 05/17/18 1029 215 lb (97.5 kg)     Height 05/17/18 1029 5\' 4"  (1.626 m)     Head Circumference --      Peak Flow --      Pain Score 05/17/18 1028 10     Pain Loc --      Pain Edu? --      Excl. in GC? --      No data found.  Updated Vital Signs BP 125/81 (BP Location: Right Arm)   Pulse 96   Temp 97.9 F (36.6 C) (Oral)   Ht 5\' 4"  (1.626 m)   Wt 215 lb (97.5 kg)   LMP 04/18/2018 (Exact Date)   SpO2 99%   BMI 36.90 kg/m   Visual Acuity Right Eye Distance:   Left Eye Distance:   Bilateral Distance:    Right Eye Near:   Left Eye Near:    Bilateral Near:     Physical Exam  Constitutional: She appears well-developed and well-nourished.  HENT:  Head: Normocephalic.  Cardiovascular: Normal rate.  Pulmonary/Chest: Effort normal.  Musculoskeletal: She exhibits tenderness.  Swelling right knee. Slight erythema, tender to touch  nv and ns intact   Neurological: She is alert.  Skin: Skin is warm.  Psychiatric: She has a normal mood and affect.  Nursing note and vitals reviewed.    UC Treatments / Results  Labs (all labs ordered are listed, but only abnormal results are displayed) Labs Reviewed - No data to display  EKG None  Radiology No results found.  Procedures Procedures (including critical care time)  Medications Ordered in UC Medications - No data to display  Initial Impression / Assessment and Plan / UC Course  I have reviewed the triage vital signs and the nursing notes.  Pertinent labs & imaging results that were available during my care of the patient were reviewed by me and considered in my medical decision making (see chart for details).     MDM   Pt appears to have an early small area of cellulitis.  Pt given rx for Doxycycline.  Pt advised to continue soaking area.  Pt advised to return here or ED if symptoms worsen or cahnge.  Final Clinical Impressions(s) / UC Diagnoses   Final diagnoses:  Cellulitis of leg, right     Discharge Instructions     Return if symptoms worsen or change   ED Prescriptions    Medication Sig Dispense Auth. Provider   doxycycline (VIBRAMYCIN) 100 MG capsule Take 1 capsule (100 mg total) by mouth 2 (two) times  daily. 20 capsule Uziel Covault K, [place]   fluconazole (DIFLUCAN) 150 MG tablet Take 1 tablet (150 mg total) by mouth daily. 2 tablet Elson Areas, [place]     Controlled Substance Prescriptions [place] Controlled Substance Registry consulted? Not Applicable   Cheron Schaumann  K, PA-C 05/17/18 1213    Elson AreasSofia, Lilyahna Sirmon K, [place] 05/17/18 1213

## 2018-05-19 ENCOUNTER — Ambulatory Visit (INDEPENDENT_AMBULATORY_CARE_PROVIDER_SITE_OTHER): Payer: Self-pay | Admitting: Osteopathic Medicine

## 2018-05-19 ENCOUNTER — Encounter: Payer: Self-pay | Admitting: Osteopathic Medicine

## 2018-05-19 VITALS — BP 127/75 | HR 97 | Temp 98.6°F | Wt 214.8 lb

## 2018-05-19 DIAGNOSIS — L0291 Cutaneous abscess, unspecified: Secondary | ICD-10-CM

## 2018-05-19 MED ORDER — CLINDAMYCIN HCL 300 MG PO CAPS: 300.0000 mg | ORAL_CAPSULE | Freq: Three times a day (TID) | ORAL | 0 refills | Status: DC

## 2018-05-19 MED ORDER — OXYCODONE-ACETAMINOPHEN 5-325 MG PO TABS: 1.0000 | ORAL_TABLET | Freq: Four times a day (QID) | ORAL | 0 refills | Status: DC | PRN

## 2018-05-19 NOTE — Patient Instructions (Signed)
Keep wound clean and keep wick in place at least 24 hours, though if it comes out before then, that's ok Don't get the area wet other than to gently clean it for about 24 hours After than can shower, no baths Different antibiotics  Await culture results

## 2018-05-19 NOTE — Progress Notes (Signed)
HPI: Michelle Berger is a 32 y.o. female who  has a past medical history of Headache.  she [facility] today, 05/19/18,  for chief complaint of:  Knee infection  Recently seen in urgent care for cellulitis of the knee.  This was 2 days ago.  Patient states that pain is a little bit worse and she would like to get this issue resolved prior to having to leave for a trip, she has already had to delay leaving.  She denies fever/chills.  She is able to ambulate, no apparent internal knee pain though she notes some patellar pain.  No streaking of the skin.  No drainage from the wound though she states the area of the scab has gotten a bit larger   Past medical history, surgical history, and family history reviewed.  Current medication list and allergy/intolerance information reviewed.   (See remainder of HPI, ROS, Phys Exam below)    ASSESSMENT/PLAN: Successful incision and drainage, collected wound culture, will await results.  Change antibiotics to cover for possible MRSA.   Abscess - Plan: Wound culture   PRE-OP DIAGNOSIS: Abscess of R knee skin  POST-OP DIAGNOSIS: Same  PROCEDURE: incision and drainage of abscess Performing Physician: Lyn HollingsheadAlexander    PROCEDURE:  Patient informed consent obtained verbally after discussion of risks (including but not limited to pain, infection, bleeding, damage to surrounding tissues, incomplete evacuation/treatment of infection, recurrence)  and benefits (adequate treatment and diagnosis, relief of pain). All questions answered prior to procedure.   A timeout protocol was performed prior to initiating the procedure.  The area was prepared and draped in the usual, sterile manner. The site was anesthetized with 2 cc xylocaine.  Had to stop and reassess frequently, patient was uncomfortable during the procedure but able to sit still and tolerated with breaks.  A linear incision along the local skin lines was  made and the purulent material expressed. The abcess was explored thoroughly and sequestered pockets were evacuated. Bleeding was minimal.   Packing: in place    Followup: The patient tolerated the procedure well without complications. Standard post-procedure care is explained and return precautions are given, patient was provided with printed information & instructions.     Meds ordered this encounter  Medications  . clindamycin (CLEOCIN) 300 MG capsule    Sig: Take 1 capsule (300 mg total) by mouth 3 (three) times daily.    Dispense:  21 capsule    Refill:  0  . oxyCODONE-acetaminophen (PERCOCET) 5-325 MG tablet    Sig: Take 1-2 tablets by mouth every 6 (six) hours as needed for severe pain. Use sparingly to avoid tolerance/dependence    Dispense:  15 tablet    Refill:  0    Patient Instructions  Keep wound clean and keep wick in place at least 24 hours, though if it comes out before then, that's ok Don't get the area wet other than to gently clean it for about 24 hours After than can shower, no baths Different antibiotics  Await culture results    Follow-up plan: Return if symptoms worsen or fail to improve.     ############################################ ############################################ ############################################ ############################################    Outpatient Encounter Medications as of 05/19/2018  Medication Sig  . buPROPion (WELLBUTRIN XL) 300 MG 24 hr tablet Take 1 tablet (300 mg total) by mouth daily.  [name]. doxycycline (VIBRAMYCIN) 100 MG capsule Take 1 capsule (100 mg total) by mouth 2 (two) times daily.  . fluconazole (DIFLUCAN)  150 MG tablet Take 1 tablet (150 mg total) by mouth daily.  [name] ibuprofen (ADVIL,MOTRIN) 800 MG tablet Take 1 tablet (800 mg total) by mouth every 8 (eight) hours as needed.  . phentermine (ADIPEX-P) 37.5 MG tablet One tab by mouth qAM  . promethazine (PHENERGAN) 25 MG tablet Take 1 tablet (25 mg  total) by mouth every 6 (six) hours as needed for nausea or vomiting.  [name] scopolamine (TRANSDERM-SCOP, 1.5 MG,) 1 MG/3DAYS Place 1 patch (1.5 mg total) onto the skin every 3 (three) days.  [name] sertraline (ZOLOFT) 25 MG tablet Take 1 tablet (25 mg total) by mouth daily.   No facility-administered encounter medications on file as of 05/19/2018.    Allergies  Allergen Reactions  . Sulfa Antibiotics Swelling  . Sulfur       Review of Systems:  Constitutional: No fever  HEENT: No  headache  Cardiac: No  chest pain, No  pressure, No palpitations  Respiratory:  No  shortness of breath. No  Cough  Musculoskeletal: No new myalgia/arthralgia  Skin: +Rash   Exam:  BP 127/75 (BP Location: Left Arm, Patient Position: Sitting, Cuff Size: Large)   Pulse 97   Temp 98.6 F (37 C) (Oral)   Wt 214 lb 12.8 oz (97.4 kg)   BMI 36.87 kg/m   Constitutional: VS see above. General Appearance: alert, well-developed, well-nourished, NAD  Eyes: Normal lids and conjunctive, non-icteric sclera  Ears, Nose, Mouth, Throat: MMM, Normal external inspection ears/nares/mouth/lips/gums.  Neck: No masses, trachea midline.   Respiratory: Normal respiratory effort.   Musculoskeletal: Gait normal. Symmetric and independent movement of all extremities normal knee range of motion on the right, no apparent effusion  Neurological: Normal balance/coordination. No tremor.  Skin: warm, dry.  About dime sized patch of erythema/fluctuant skin lateral to right patella.  Psychiatric: Normal judgment/insight. Normal mood and affect. Oriented x3.   Visit summary with medication list and pertinent instructions was printed for patient to review, advised to alert [place] if any changes needed. All questions at time of visit were answered - patient instructed to contact office with any additional concerns. ER/RTC precautions were reviewed with the patient and understanding verbalized.   Follow-up plan: Return if symptoms  worsen or fail to improve.    Please note: voice recognition software was used to produce this document, and typos may escape review. Please contact Dr. Lyn Hollingshead for any needed clarifications.

## 2018-05-22 LAB — WOUND CULTURE
MICRO NUMBER:: 90905911
SPECIMEN QUALITY:: ADEQUATE

## 2018-07-26 ENCOUNTER — Encounter: Payer: Self-pay | Admitting: *Deleted

## 2018-07-26 ENCOUNTER — Emergency Department (INDEPENDENT_AMBULATORY_CARE_PROVIDER_SITE_OTHER): Payer: Self-pay

## 2018-07-26 ENCOUNTER — Other Ambulatory Visit: Payer: Self-pay

## 2018-07-26 ENCOUNTER — Emergency Department
Admission: EM | Admit: 2018-07-26 | Discharge: 2018-07-26 | Disposition: A | Discharge status: Home / Self Care | Payer: Self-pay | Source: Home / Self Care | Attending: Emergency Medicine | Admitting: Emergency Medicine

## 2018-07-26 DIAGNOSIS — J189 Pneumonia, unspecified organism: Secondary | ICD-10-CM

## 2018-07-26 DIAGNOSIS — R05 Cough: Secondary | ICD-10-CM

## 2018-07-26 DIAGNOSIS — R918 Other nonspecific abnormal finding of lung field: Secondary | ICD-10-CM

## 2018-07-26 DIAGNOSIS — J181 Lobar pneumonia, unspecified organism: Secondary | ICD-10-CM

## 2018-07-26 HISTORY — DX: Depression, unspecified: F32.A

## 2018-07-26 HISTORY — DX: Major depressive disorder, single episode, unspecified: F32.9

## 2018-07-26 LAB — POCT CBC W AUTO DIFF (K'VILLE URGENT CARE)

## 2018-07-26 LAB — POCT INFLUENZA A/B
INFLUENZA A, POC: NEGATIVE
INFLUENZA B, POC: NEGATIVE

## 2018-07-26 MED ORDER — CEFDINIR 300 MG PO CAPS: 600.0000 mg | ORAL_CAPSULE | Freq: Every day | ORAL | 0 refills | Status: DC

## 2018-07-26 MED ORDER — BENZONATATE 100 MG PO CAPS: 100.0000 mg | ORAL_CAPSULE | Freq: Three times a day (TID) | ORAL | 0 refills | Status: DC | PRN

## 2018-07-26 NOTE — Discharge Instructions (Addendum)
Take medication as instructed. Recheck in the emergency room in 48 hours. Drink good quantities of fluids. If you develop shortness of breath worsening respiratory symptoms please go immediately to the emergency room.

## 2018-07-26 NOTE — ED Triage Notes (Signed)
Pt c/o cough, fever 101.8, and HA x 1 day.

## 2018-07-26 NOTE — ED Provider Notes (Addendum)
[facility]    CSN: [id number] Arrival date & time: 07/26/18  1205     History   Chief Complaint Chief Complaint  Patient presents with  . Cough  . Fever    HPI Michelle Berger is a 32 y.o. female.   HPI Patient is not sure but believes her symptoms started Thursday or Friday. She started feeling tired and achy with no energy.Her son had surgery on Friday so she was very busy but on Saturday she started having a cough which at times was productive. She started to have difficulty with her breathing and feltit was hard to breathe. She has had a temperature up to 102. She denies recent travel. She has not had a flu shot. She has had a bad headache as well as aching in her legs.of note in February 2019 she had a chest x-ray which showed a right lower lobe pneumonia treated with  Also of noteis she is travelingthis weekbeginning Thursday on a 2 week vacation trip.atient did want me to know she was at the fair on Saturday in [place]. She states her symptoms began on Thursday. Past Medical History:  Diagnosis Date  . Depression   . Headache     Patient Active Problem List   Diagnosis Date Noted  . Plantar fasciitis, right 01/26/2018  . Motion sickness 11/18/2017  . Recurrent candidiasis of vagina 05/05/2017  . Abnormal weight gain 05/05/2017  . Foreign body in auricle of right ear 05/05/2017  . Sebaceous cyst 01/28/2017  . Inattention 01/28/2017  . No energy 01/28/2017  . PMDD (premenstrual dysphoric disorder) 12/04/2016  . Migraines 05/19/2016  . History of migraine headaches 01/04/2015  . Tendinitis, de Quervain's 01/04/2015  . Major depression 07/12/2013  . Class 2 obesity due to excess calories without serious comorbidity with body mass index (BMI) of 38.0 to 38.9 in adult 07/12/2013  . Preventive measure 07/12/2013  . Allergic rhinitis 07/12/2013    Past Surgical History:  Procedure Laterality Date  . LIPOSUCTION MULTIPLE BODY PARTS N/A    abdomen and back  . tummy tuck      OB History   None      Home Medications    Prior to Admission medications   Medication Sig Start Date End Date Taking? Authorizing Provider  buPROPion (WELLBUTRIN XL) 300 MG 24 hr tablet Take 1 tablet (300 mg total) by mouth daily. 11/18/17   Breeback, Jade L, PA-C  hydrOXYzine (VISTARIL) 50 MG capsule Take by mouth.    [provider]  ibuprofen (ADVIL,MOTRIN) 800 MG tablet Take 1 tablet (800 mg total) by mouth every 8 (eight) hours as needed. 01/26/18   Monica Becton, MD  phentermine (ADIPEX-P) 37.5 MG tablet One tab by mouth qAM 04/28/17   Carlis Stable, PA-C  promethazine (PHENERGAN) 25 MG tablet Take 1 tablet (25 mg total) by mouth every 6 (six) hours as needed for nausea or vomiting. 11/18/17   Breeback, Jade L, PA-C  scopolamine (TRANSDERM-SCOP, 1.5 MG,) 1 MG/3DAYS Place 1 patch (1.5 mg total) onto the skin every 3 (three) days. 01/26/18   Monica Becton, MD  sertraline (ZOLOFT) 25 MG tablet Take 1 tablet (25 mg total) by mouth daily. 11/18/17   Jomarie Longs, PA-C    Family History History reviewed. No pertinent family history.  Social History Social History   Tobacco Use  . Smoking status: Never Smoker  . Smokeless tobacco: Never Used  Substance Use Topics  . Alcohol use: Yes  Alcohol/week: 6.0 standard drinks    Types: 6 Glasses of wine per week  . Drug use: No     Allergies   Sulfa antibiotics and Sulfur   Review of Systems Review of Systems  Constitutional: Positive for chills, fatigue and fever.  HENT: Positive for congestion. Negative for sore throat.   Eyes: Negative.   Respiratory: Positive for cough, chest tightness and shortness of breath.   Cardiovascular: Negative.   Gastrointestinal: Negative.   Endocrine: Negative.   Musculoskeletal: Positive for myalgias.  Neurological: Negative.   Psychiatric/Behavioral: Negative.      Physical Exam Triage Vital Signs ED Triage  Vitals [07/26/18 1305]  Enc Vitals Group     BP 133/85     Pulse Rate (!) 106     Resp 18     Temp 99 F (37.2 C)     Temp Source Oral     SpO2 100 %     Weight 215 lb (97.5 kg)     Height 5\' 3"  (1.6 m)     Head Circumference      Peak Flow      Pain Score 0     Pain Loc      Pain Edu?      Excl. in GC?    No data found.  Updated Vital Signs BP 133/85 (BP Location: Right Arm)   Pulse (!) 106   Temp 99 F (37.2 C) (Oral)   Resp 18   Ht 5\' 3"  (1.6 m)   Wt 97.5 kg   SpO2 100%   BMI 38.09 kg/m   Visual Acuity Right Eye Distance:   Left Eye Distance:   Bilateral Distance:    Right Eye Near:   Left Eye Near:    Bilateral Near:     Physical Exam  Constitutional: She appears well-developed and well-nourished.  HENT:  Mouth/Throat: Oropharynx is clear and moist.  Eyes: Pupils are equal, round, and reactive to light.  Neck: Normal range of motion.  Cardiovascular: Normal rate and normal heart sounds.  No murmur heard. Pulmonary/Chest: Effort normal. No respiratory distress. She has no wheezes. She has no rales. She exhibits no tenderness.  Here are occasional rhonchi noted in the left base.  Musculoskeletal: Normal range of motion.  There is no calf tenderness  Neurological: She is alert.     UC Treatments / Results  Labs (all labs ordered are listed, but only abnormal results are displayed) Labs Reviewed  POCT CBC W AUTO DIFF ([facility])  POCT INFLUENZA A/B    EKG None  Radiology Dg Chest 2 View  Result Date: 07/26/2018 CLINICAL DATA:  Fever and cough for 1 week EXAM: CHEST - 2 VIEW COMPARISON:  Chest x-ray of 12/01/2017 FINDINGS: There prominent markings medially at the left lung base consistent with pneumonia. Otherwise the lungs are clear. Also there are somewhat prominent central markings which may indicate bronchitis. Mediastinal and hilar contours are unremarkable and heart size is stable. No bony abnormality is seen. IMPRESSION: 1. Left  lower lobe opacity consistent with pneumonia. 2. Somewhat prominent central markings also are noted which may indicate bronchitis. Electronically Signed   By: Dwyane Dee M.D.   On: 07/26/2018 13:57    Procedures Procedures (including critical care time)  Medications Ordered in UC Medications - No data to display  Initial Impression / Assessment and Plan / UC Course  I have reviewed the triage vital signs and the nursing notes.  Pertinent labs & imaging results  that were available during my care of the patient were reviewed by me and considered in my medical decision making (see chart for details). hite count is 6077% segs. Chest x-ray shows left lower lobe infiltrate consistent with pneumonia. Flu test was negative. Will treat with Omnicef and Tessalon Perles since she did well with this with her previous pneumonia. Follow-up will be in 48 hours. She was given the danger signs to watch for and if these develop to present to the emergency room. She has not been to the fair in [place]. She did go to the fair in [place] but had already been ill for 2 days.patient was advised the antibiotics she is on are not effective against Legionella .    Final Clinical Impressions(s) / UC Diagnoses   Final diagnoses:  Community acquired pneumonia of left lower lobe of lung [facility])     Discharge Instructions     Take medication as instructed. Recheck in the emergency room in 48 hours. Drink good quantities of fluids. If you develop shortness of breath worsening respiratory symptoms please go immediately to the emergency room.    ED Prescriptions    None     Controlled Substance Prescriptions [place] Controlled Substance Registry consulted? Not Applicable   Collene Gobble, MD 07/26/18 1441    Collene Gobble, MD 07/26/18 1505

## 2018-07-28 ENCOUNTER — Ambulatory Visit (INDEPENDENT_AMBULATORY_CARE_PROVIDER_SITE_OTHER): Payer: Self-pay | Admitting: Physician Assistant

## 2018-07-28 ENCOUNTER — Encounter: Payer: Self-pay | Admitting: Physician Assistant

## 2018-07-28 VITALS — BP 103/75 | HR 87 | Temp 98.6°F | Ht 63.0 in | Wt 215.0 lb

## 2018-07-28 DIAGNOSIS — R05 Cough: Secondary | ICD-10-CM

## 2018-07-28 DIAGNOSIS — R059 Cough, unspecified: Secondary | ICD-10-CM

## 2018-07-28 DIAGNOSIS — J189 Pneumonia, unspecified organism: Secondary | ICD-10-CM

## 2018-07-28 DIAGNOSIS — J181 Lobar pneumonia, unspecified organism: Secondary | ICD-10-CM

## 2018-07-28 MED ORDER — ALBUTEROL SULFATE HFA 108 (90 BASE) MCG/ACT IN AERS: 1.0000 | INHALATION_SPRAY | Freq: Four times a day (QID) | RESPIRATORY_TRACT | 0 refills | Status: DC | PRN

## 2018-07-28 MED ORDER — IPRATROPIUM-ALBUTEROL 0.5-2.5 (3) MG/3ML IN SOLN
3.0000 mL | Freq: Once | RESPIRATORY_TRACT | Status: AC
  Administered 2018-07-28: 3 mL via RESPIRATORY_TRACT

## 2018-07-28 NOTE — Progress Notes (Signed)
   Subjective:    Patient ID: Michelle Berger, female    DOB: [birth year], 32 y.o.   MRN: [id number]  HPI  Pt is a 32 yo female who presents to the clinic to follow up after pneumonia dx on 07/26/18 in ED. She was started on omnicef and tessalon pearls. She is feeling much better. No fever, chills, body aches. She continues to cough. She is traveling in next 2 days for 12 days. She wanted to be rechecked.   This is her 2nd episode of pneumonia in last year. No hx of lung diseases such as asthma or COPD. She has never smoked.   .. Active Ambulatory Problems    Diagnosis Date Noted  . Major depression 07/12/2013  . Class 2 obesity due to excess calories without serious comorbidity with body mass index (BMI) of 38.0 to 38.9 in adult 07/12/2013  . Preventive measure 07/12/2013  . Allergic rhinitis 07/12/2013  . History of migraine headaches 01/04/2015  . Tendinitis, de Quervain's 01/04/2015  . PMDD (premenstrual dysphoric disorder) 12/04/2016  . Sebaceous cyst 01/28/2017  . Inattention 01/28/2017  . No energy 01/28/2017  . Recurrent candidiasis of vagina 05/05/2017  . Abnormal weight gain 05/05/2017  . Foreign body in auricle of right ear 05/05/2017  . Motion sickness 11/18/2017  . Plantar fasciitis, right 01/26/2018  . Migraines 05/19/2016   Resolved Ambulatory Problems    Diagnosis Date Noted  . Acute maxillary sinusitis 11/17/2013   Past Medical History:  Diagnosis Date  . Depression   . Headache      Review of Systems See HPI.     Objective:   Physical Exam  Constitutional: She is oriented to person, place, and time. She appears well-developed and well-nourished.  HENT:  Head: Normocephalic and atraumatic.  Right Ear: External ear normal.  Left Ear: External ear normal.  Eyes: Conjunctivae are normal.  Neck: Normal range of motion. Neck supple.  Cardiovascular: Normal rate and regular rhythm.  Pulmonary/Chest: Effort normal.  Coarse breath sounds and  crackles heard over the left lower lung.   Neurological: She is alert and oriented to person, place, and time.  Skin: No rash noted.  Psychiatric: She has a normal mood and affect. Her behavior is normal.          Assessment & Plan:   Marland KitchenMarland KitchenDiagnoses and all orders for this visit:  Pneumonia of left lower lobe due to infectious organism (HCC) -     albuterol (PROVENTIL HFA;VENTOLIN HFA) 108 (90 Base) MCG/ACT inhaler; Inhale 1-2 puffs into the lungs every 6 (six) hours as needed for wheezing. -     ipratropium-albuterol (DUONEB) 0.5-2.5 (3) MG/3ML nebulizer solution 3 mL  Cough -     albuterol (PROVENTIL HFA;VENTOLIN HFA) 108 (90 Base) MCG/ACT inhaler; Inhale 1-2 puffs into the lungs every 6 (six) hours as needed for wheezing. -     ipratropium-albuterol (DUONEB) 0.5-2.5 (3) MG/3ML nebulizer solution 3 mL   Pt is looking much better. Her vitals are great today. Reassured her takes some time for cough and energy to come back after pneumonia. Nebulizer was given in office. She felt like she could breath better. Albuterol inhaler prescribed to use for the next couple of days. Encouraged rest. Ok to travel but take it easy and stay hydrated. Use OTC cough suppressants and tessalon pearls.   Due to pneuomnia twice this year. Consider:  Pneumonia shot once feeling better.

## 2018-07-28 NOTE — Patient Instructions (Signed)

## 2018-09-06 ENCOUNTER — Ambulatory Visit (INDEPENDENT_AMBULATORY_CARE_PROVIDER_SITE_OTHER): Payer: Self-pay

## 2018-09-06 ENCOUNTER — Ambulatory Visit (INDEPENDENT_AMBULATORY_CARE_PROVIDER_SITE_OTHER): Payer: Self-pay | Admitting: Physician Assistant

## 2018-09-06 ENCOUNTER — Encounter: Payer: Self-pay | Admitting: Physician Assistant

## 2018-09-06 VITALS — BP 114/76 | HR 75 | Temp 98.8°F | Ht 63.0 in | Wt 221.0 lb

## 2018-09-06 DIAGNOSIS — Z8701 Personal history of pneumonia (recurrent): Secondary | ICD-10-CM

## 2018-09-06 DIAGNOSIS — J0140 Acute pansinusitis, unspecified: Secondary | ICD-10-CM

## 2018-09-06 DIAGNOSIS — R05 Cough: Secondary | ICD-10-CM

## 2018-09-06 DIAGNOSIS — R059 Cough, unspecified: Secondary | ICD-10-CM

## 2018-09-06 MED ORDER — CEFDINIR 300 MG PO CAPS: 600.0000 mg | ORAL_CAPSULE | Freq: Every day | ORAL | 0 refills | Status: DC

## 2018-09-06 MED ORDER — PREDNISONE 50 MG PO TABS: ORAL_TABLET | ORAL | 0 refills | Status: DC

## 2018-09-06 NOTE — Patient Instructions (Signed)
Get CXR

## 2018-09-06 NOTE — Progress Notes (Signed)
Subjective:    Patient ID: Michelle Berger, female    DOB: Mar 30, 1986, 32 y.o.   MRN: [id number]  HPI  Patient is a 32 year old female who presents to the clinic with upper respiratory symptoms and cough for the last 5 days.  She is concerned more because she has had pneumonia twice this year.  She noticed last week when she was exercising some chest tightness and feeling like she could not get air and.  She has used her rescue inhaler a few times with some benefit.  She denies any asthma as a child but she did use an inhaler when she exercise.  She denies any smoking, vaping, any use of inhalant.  Few days after she felt the chest tightness she developed more upper respiratory symptoms.  She is now experiencing a lot of sinus pressure, ear pain, sinus drainage and more productive cough.  She has no sick contacts.  She is using over-the-counter Sudafed and Mucinex with some relief.  She has not been using any nasal sprays.  .. Active Ambulatory Problems    Diagnosis Date Noted  . Major depression 07/12/2013  . Class 2 obesity due to excess calories without serious comorbidity with body mass index (BMI) of 38.0 to 38.9 in adult 07/12/2013  . Preventive measure 07/12/2013  . Allergic rhinitis 07/12/2013  . History of migraine headaches 01/04/2015  . Tendinitis, de Quervain's 01/04/2015  . PMDD (premenstrual dysphoric disorder) 12/04/2016  . Sebaceous cyst 01/28/2017  . Inattention 01/28/2017  . No energy 01/28/2017  . Recurrent candidiasis of vagina 05/05/2017  . Abnormal weight gain 05/05/2017  . Foreign body in auricle of right ear 05/05/2017  . Motion sickness 11/18/2017  . Plantar fasciitis, right 01/26/2018  . Migraines 05/19/2016   Resolved Ambulatory Problems    Diagnosis Date Noted  . Acute maxillary sinusitis 11/17/2013   Past Medical History:  Diagnosis Date  . Depression   . Headache       Review of Systems See HPI.     Objective:   Physical Exam   Constitutional: She is oriented to person, place, and time. She appears well-developed and well-nourished.  HENT:  Head: Normocephalic and atraumatic.  Right Ear: External ear normal.  Left Ear: External ear normal.  Mouth/Throat: Oropharynx is clear and moist.  TMs clear with good light reflex and no erythema. Some mild effusions at base of TM bilaterally.  Some mild tenderness over maxillary sinses.  Bilateral nasal turbinates red and swollen.    Eyes: Conjunctivae are normal. Right eye exhibits no discharge. Left eye exhibits no discharge.  Cardiovascular: Normal rate and regular rhythm.  Pulmonary/Chest: Effort normal and breath sounds normal. She has no wheezes.  Lymphadenopathy:    She has no cervical adenopathy.  Neurological: She is alert and oriented to person, place, and time.  Skin: No rash noted.  Psychiatric: She has a normal mood and affect. Her behavior is normal.          Assessment & Plan:  [name].[name].Michelle Berger was seen today for sinus problem.  Diagnoses and all orders for this visit:  Acute non-recurrent pansinusitis -     DG Chest 2 View -     cefdinir (OMNICEF) 300 MG capsule; Take 2 capsules (600 mg total) by mouth daily. -     predniSONE (DELTASONE) 50 MG tablet; Take one tablet for 5 days.  Cough -     DG Chest 2 View -     cefdinir (OMNICEF) 300 MG capsule;  Take 2 capsules (600 mg total) by mouth daily. -     predniSONE (DELTASONE) 50 MG tablet; Take one tablet for 5 days.  History of pneumonia -     DG Chest 2 View   Patient's vitals are reassuring today.  With her history of pneumonia twice in the last year I would like to get a chest x-ray despite her normal auscultating lungs.  Her symptoms today do present like sinusitis but she is having symptoms of chest constriction and benefit from rescue inhaler.  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  When she is feeling better I would like to do some spirometry to evaluate her lung function.  Due to her  history and 4 days of symptoms we will treat for sinusitis.  Strongly encouraged her to use Flonase over-the-counter 2 sprays each nostril once a day.  Discussed how to use Flonase properly in office. Follow up as needed.   spirometry in 2-3 weeks.

## 2018-09-06 NOTE — Progress Notes (Signed)
Call pt: normal CXR. No pneumonia.

## 2018-09-09 ENCOUNTER — Telehealth: Payer: Self-pay

## 2018-09-09 ENCOUNTER — Other Ambulatory Visit: Payer: Self-pay

## 2018-09-09 MED ORDER — PREDNISONE 20 MG PO TABS: ORAL_TABLET | ORAL | 0 refills | Status: AC

## 2018-09-09 NOTE — Telephone Encounter (Signed)
Dava called this morning saying that since her last appointment with Jade on Monday, she is still not feeling any better. Patient wanted to know if we could call out anything else for her. I spoke with provider, and she instructed me to call over a prednisone taper to her pharmacy. The prescription was reviewed and signed by Dr. Linford ArnoldMetheney before being sent over to pharmacy. I have called and left a voicemail for patient notifying her of the prescription being sent to her pharmacy. No further questions or concerns at this time.

## 2018-09-10 ENCOUNTER — Ambulatory Visit (INDEPENDENT_AMBULATORY_CARE_PROVIDER_SITE_OTHER): Payer: Self-pay | Admitting: Physician Assistant

## 2018-09-10 ENCOUNTER — Encounter: Payer: Self-pay | Admitting: Physician Assistant

## 2018-09-10 VITALS — BP 123/73 | HR 79 | Ht 63.0 in | Wt 217.0 lb

## 2018-09-10 DIAGNOSIS — J209 Acute bronchitis, unspecified: Secondary | ICD-10-CM

## 2018-09-10 DIAGNOSIS — F19982 Other psychoactive substance use, unspecified with psychoactive substance-induced sleep disorder: Secondary | ICD-10-CM

## 2018-09-10 MED ORDER — HYDROCOD POLST-CPM POLST ER 10-8 MG/5ML PO SUER: 5.0000 mL | Freq: Two times a day (BID) | ORAL | 0 refills | Status: DC | PRN

## 2018-09-10 MED ORDER — TRAZODONE HCL 50 MG PO TABS: 25.0000 mg | ORAL_TABLET | Freq: Every evening | ORAL | 0 refills | Status: DC | PRN

## 2018-09-10 MED ORDER — BUDESONIDE-FORMOTEROL FUMARATE 160-4.5 MCG/ACT IN AERO: 2.0000 | INHALATION_SPRAY | Freq: Two times a day (BID) | RESPIRATORY_TRACT | 0 refills | Status: DC

## 2018-09-10 MED ORDER — AZITHROMYCIN 250 MG PO TABS: ORAL_TABLET | ORAL | 0 refills | Status: DC

## 2018-09-10 MED ORDER — CEFTRIAXONE SODIUM 250 MG IJ SOLR
1.0000 g | Freq: Once | INTRAMUSCULAR | Status: AC
  Administered 2018-09-10: 1 g via INTRAMUSCULAR

## 2018-09-10 NOTE — Progress Notes (Signed)
Subjective:    Patient ID: Michelle Berger, female    DOB: 03-05-1986, 32 y.o.   MRN: [id number]  HPI Patient is a 32 year old female with recent year history of 2 episodes of pneumonia who presents to the clinic with bronchitis but she is still battling.  She was seen in the clinic approximately a week ago and given a prednisone burst and Omnicef.  She is still taking the Omnicef.  She does feel like her sinus symptoms have improved but she feels like her chest symptoms have worsened.  She reports she always feels like she has pneumonia again.  Chest x-ray was done a week ago negative for pneumonia.  She is using her rescue inhaler multiple times a day.  She denies any fever, chills.  She is very weak and worn out.  She only has a history of exercise-induced asthma is all sent.  She denies any smoking or vaping.  She cannot sleep with prednisone and requesting anything to help.   .. Active Ambulatory Problems    Diagnosis Date Noted  . Major depression 07/12/2013  . Class 2 obesity due to excess calories without serious comorbidity with body mass index (BMI) of 38.0 to 38.9 in adult 07/12/2013  . Preventive measure 07/12/2013  . Allergic rhinitis 07/12/2013  . History of migraine headaches 01/04/2015  . Tendinitis, de Quervain's 01/04/2015  . PMDD (premenstrual dysphoric disorder) 12/04/2016  . Sebaceous cyst 01/28/2017  . Inattention 01/28/2017  . No energy 01/28/2017  . Recurrent candidiasis of vagina 05/05/2017  . Abnormal weight gain 05/05/2017  . Foreign body in auricle of right ear 05/05/2017  . Motion sickness 11/18/2017  . Plantar fasciitis, right 01/26/2018  . Migraines 05/19/2016   Resolved Ambulatory Problems    Diagnosis Date Noted  . Acute maxillary sinusitis 11/17/2013   Past Medical History:  Diagnosis Date  . Depression   . Headache       Review of Systems See HPI.     Objective:   Physical Exam  Constitutional: She is oriented to person,  place, and time. She appears well-developed and well-nourished.  HENT:  Head: Normocephalic and atraumatic.  Right Ear: External ear normal.  Left Ear: External ear normal.  Nose: Nose normal.  Mouth/Throat: Oropharynx is clear and moist. No oropharyngeal exudate.  Eyes: Conjunctivae are normal. Right eye exhibits no discharge. Left eye exhibits no discharge.  Cardiovascular: Normal rate and regular rhythm.  Pulmonary/Chest: Effort normal. She has no wheezes.  Diffuse rhonchi mid back bilateral lungs and into apex.   Lymphadenopathy:    She has no cervical adenopathy.  Neurological: She is alert and oriented to person, place, and time.  Skin:  Flushed cheeks  Psychiatric: She has a normal mood and affect. Her behavior is normal.          Assessment & Plan:  [name].[name].Diagnoses and all orders for this visit:  Acute bronchitis, unspecified organism -     chlorpheniramine-HYDROcodone (TUSSIONEX) 10-8 MG/5ML SUER; Take 5 mLs by mouth every 12 (twelve) hours as needed for cough (cough, will cause drowsiness.). -     azithromycin (ZITHROMAX) 250 MG tablet; Take 2 tablets now and then one tablet for 4 days. 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  peak flow in yellow- duoneb given Peak flows improved to  green after.   On exam and doing vitals she is stable.  Reassured her that chest x-ray was normal 1 week ago. I do think this is bronchitis and may take some time to get to 100 percent. Continue longer prednisone taper. I am going to go ahead and change her Omnicef to Z-Pak to cover for pertussis and atypical pneumonia.  I did give her a small quantity of a cough syrup that she could use at night so she can get some rest. 1g of rocephin given. To start symbicort 2 puffs bid. Follow up in 1 month.   Trazodone given to help sleep while on prednisone.    Needs spironmetry when feeling better.   [name].Spent 30 minutes with patient and greater than 50 percent of visit spent counseling patient regarding treatment plan.

## 2018-09-10 NOTE — Progress Notes (Signed)
Pt was sent prednisone yesterday to start.

## 2018-09-10 NOTE — Patient Instructions (Addendum)
symbicort 2 puffs twice a day.  Let me know on Monday how are you doing?    Acute Bronchitis, Adult Acute bronchitis is when air tubes (bronchi) in the lungs suddenly get swollen. The condition can make it hard to breathe. It can also cause these symptoms:  A cough.  Coughing up clear, yellow, or green mucus.  Wheezing.  Chest congestion.  Shortness of breath.  A fever.  Body aches.  Chills.  A sore throat.  Follow these instructions at home: Medicines  Take over-the-counter and prescription medicines only as told by your doctor.  If you were prescribed an antibiotic medicine, take it as told by your doctor. Do not stop taking the antibiotic even if you start to feel better. General instructions  Rest.  Drink enough fluids to keep your pee (urine) clear or pale yellow.  Avoid smoking and secondhand smoke. If you smoke and you need help quitting, ask your doctor. Quitting will help your lungs heal faster.  Use an inhaler, cool mist vaporizer, or humidifier as told by your doctor.  Keep all follow-up visits as told by your doctor. This is important. How is this prevented? To lower your risk of getting this condition again:  Wash your hands often with soap and water. If you cannot use soap and water, use hand sanitizer.  Avoid contact with people who have cold symptoms.  Try not to touch your hands to your mouth, nose, or eyes.  Make sure to get the flu shot every year.  Contact a doctor if:  Your symptoms do not get better in 2 weeks. Get help right away if:  You cough up blood.  You have chest pain.  You have very bad shortness of breath.  You become dehydrated.  You faint (pass out) or keep feeling like you are going to pass out.  You keep throwing up (vomiting).  You have a very bad headache.  Your fever or chills gets worse. This information is not intended to replace advice given to you by your health care provider. Make sure you discuss any  questions you have with your health care provider. Document Released: 03/24/2008 Document Revised: 05/14/2016 Document Reviewed: 03/26/2016 Elsevier Interactive Patient Education  [employer]2018 Elsevier Inc.

## 2018-11-03 ENCOUNTER — Ambulatory Visit (INDEPENDENT_AMBULATORY_CARE_PROVIDER_SITE_OTHER): Payer: Self-pay | Admitting: Physician Assistant

## 2018-11-03 ENCOUNTER — Encounter: Payer: Self-pay | Admitting: Physician Assistant

## 2018-11-03 VITALS — BP 124/68 | HR 104 | Wt 227.0 lb

## 2018-11-03 DIAGNOSIS — Z23 Encounter for immunization: Secondary | ICD-10-CM

## 2018-11-03 DIAGNOSIS — Z8701 Personal history of pneumonia (recurrent): Secondary | ICD-10-CM

## 2018-11-03 NOTE — Progress Notes (Signed)
   Subjective:    Patient ID: Michelle Berger, female    DOB: 08-09-1986, 33 y.o.   MRN: [id number]  HPI Patient is a 33 year old female who presents to the clinic to follow-up after multiple episodes of pneumonia and bronchitis last year.  She is feeling great today.  She denies any shortness of breath, wheezing, cough, congestion.  She is not using any inhalers.  She came in to discuss possible need for pneumonia shot that we had discussed last year. .. Active Ambulatory Problems    Diagnosis Date Noted  . Major depression 07/12/2013  . Class 2 obesity due to excess calories without serious comorbidity with body mass index (BMI) of 38.0 to 38.9 in adult 07/12/2013  . Preventive measure 07/12/2013  . Allergic rhinitis 07/12/2013  . History of migraine headaches 01/04/2015  . Tendinitis, de Quervain's 01/04/2015  . PMDD (premenstrual dysphoric disorder) 12/04/2016  . Sebaceous cyst 01/28/2017  . Inattention 01/28/2017  . No energy 01/28/2017  . Recurrent candidiasis of vagina 05/05/2017  . Abnormal weight gain 05/05/2017  . Foreign body in auricle of right ear 05/05/2017  . Motion sickness 11/18/2017  . Plantar fasciitis, right 01/26/2018  . Migraines 05/19/2016  . History of recurrent pneumonia 11/05/2018   Resolved Ambulatory Problems    Diagnosis Date Noted  . Acute maxillary sinusitis 11/17/2013   Past Medical History:  Diagnosis Date  . Depression   . Headache      Review of Systems  All other systems reviewed and are negative.      Objective:   Physical Exam Vitals signs reviewed.  Constitutional:      Appearance: Normal appearance. She is obese.  HENT:     Head: Normocephalic and atraumatic.  Cardiovascular:     Rate and Rhythm: Normal rate and regular rhythm.     Pulses: Normal pulses.  Pulmonary:     Effort: Pulmonary effort is normal.     Breath sounds: Normal breath sounds. No wheezing or rhonchi.  Neurological:     General: No focal deficit  present.     Mental Status: She is alert and oriented to person, place, and time.  Psychiatric:        Mood and Affect: Mood normal.        Behavior: Behavior normal.           Assessment & Plan:  .Marland KitchenMarland KitchenCrystal was seen today for follow-up.  Diagnoses and all orders for this visit:  History of recurrent pneumonia  Need for pneumococcal vaccination -     Pneumococcal polysaccharide vaccine 23-valent greater than or equal to 2yo subcutaneous/IM  pt declines flu shot.  Discussed need for pneumonia shot. Pt agrees.   Pt's symptoms have completely resolved. Discussed spirometry testing. Pt declined today.

## 2018-11-05 DIAGNOSIS — Z8701 Personal history of pneumonia (recurrent): Secondary | ICD-10-CM | POA: Insufficient documentation

## 2018-12-07 ENCOUNTER — Ambulatory Visit (INDEPENDENT_AMBULATORY_CARE_PROVIDER_SITE_OTHER): Payer: Self-pay | Admitting: Physician Assistant

## 2018-12-07 ENCOUNTER — Ambulatory Visit (INDEPENDENT_AMBULATORY_CARE_PROVIDER_SITE_OTHER): Payer: Self-pay

## 2018-12-07 ENCOUNTER — Encounter: Payer: Self-pay | Admitting: Physician Assistant

## 2018-12-07 VITALS — BP 127/74 | HR 102 | Temp 98.4°F | Ht 63.0 in | Wt 221.0 lb

## 2018-12-07 DIAGNOSIS — R059 Cough, unspecified: Secondary | ICD-10-CM

## 2018-12-07 DIAGNOSIS — R05 Cough: Secondary | ICD-10-CM

## 2018-12-07 DIAGNOSIS — J209 Acute bronchitis, unspecified: Secondary | ICD-10-CM

## 2018-12-07 DIAGNOSIS — J0100 Acute maxillary sinusitis, unspecified: Secondary | ICD-10-CM

## 2018-12-07 LAB — POCT INFLUENZA A/B
INFLUENZA B, POC: NEGATIVE
Influenza A, POC: NEGATIVE

## 2018-12-07 MED ORDER — AZITHROMYCIN 250 MG PO TABS: ORAL_TABLET | ORAL | 0 refills | Status: DC

## 2018-12-07 MED ORDER — PREDNISONE 20 MG PO TABS: ORAL_TABLET | ORAL | 0 refills | Status: DC

## 2018-12-07 NOTE — Progress Notes (Signed)
No pneumonia. Hold abx for now. There is bronchitis thickening suggestive of bronchitis.

## 2018-12-07 NOTE — Progress Notes (Signed)
Subjective:    Patient ID: Michelle Berger, female    DOB: 1986/04/08, 33 y.o.   MRN: [id number]  HPI   [name] presents today with shortness of breath, chest tightness , and a cough that began yesterday. She has a history of multiple episodes of pneumonia and bronchitis at the end of last year. She states that this episode seems similar and that is why she came in. She has a rescue inhaler at home that she used twice yesterday with some relief. She has not tried any other over the counter medications. She endorses wheezing, congestion, fever. She states her husband was recently sick with the flu.   .. Active Ambulatory Problems    Diagnosis Date Noted  . Major depression 07/12/2013  . Class 2 obesity due to excess calories without serious comorbidity with body mass index (BMI) of 38.0 to 38.9 in adult 07/12/2013  . Preventive measure 07/12/2013  . Allergic rhinitis 07/12/2013  . History of migraine headaches 01/04/2015  . Tendinitis, de Quervain's 01/04/2015  . PMDD (premenstrual dysphoric disorder) 12/04/2016  . Sebaceous cyst 01/28/2017  . Inattention 01/28/2017  . No energy 01/28/2017  . Recurrent candidiasis of vagina 05/05/2017  . Abnormal weight gain 05/05/2017  . Foreign body in auricle of right ear 05/05/2017  . Motion sickness 11/18/2017  . Plantar fasciitis, right 01/26/2018  . Migraines 05/19/2016  . History of recurrent pneumonia 11/05/2018   Resolved Ambulatory Problems    Diagnosis Date Noted  . Acute maxillary sinusitis 11/17/2013   Past Medical History:  Diagnosis Date  . Depression   . Headache      Review of Systems  See HPI    Objective:   Physical Exam Constitutional:      Appearance: She is ill-appearing.  HENT:     Head: Normocephalic.     Right Ear: Tympanic membrane normal.     Left Ear: Tympanic membrane normal.     Nose: Nose normal.     Mouth/Throat:     Pharynx: No oropharyngeal exudate or posterior oropharyngeal erythema.   Eyes:     Conjunctiva/sclera: Conjunctivae normal.  Cardiovascular:     Rate and Rhythm: Tachycardia present.  Pulmonary:     Breath sounds: Wheezing present.     Comments: Diminished breath sounds  After duoneb: Breath sounds improved and more full. Scattered rhonchi wheezing decreased.  Neurological:     General: No focal deficit present.     Mental Status: She is alert and oriented to person, place, and time.  Psychiatric:        Mood and Affect: Mood normal.           Assessment & Plan:  Marland KitchenMarland KitchenDiagnoses and all orders for this visit:  Acute bronchitis, unspecified organism -     DG Chest 2 View -     predniSONE (DELTASONE) 20 MG tablet; Take 3 tablets for 3 days, take 2 tablets for 3 days, take 1 tablet for 3 days, take 1/2 tablet for 4 days.  Cough -     DG Chest 2 View -     POCT Influenza A/B  Acute non-recurrent maxillary sinusitis -     azithromycin (ZITHROMAX) 250 MG tablet; Take 2 tablets now and then one tablet for 4 days.   . Results for orders placed or performed in visit on 12/07/18  POCT Influenza A/B  Result Value Ref Range   Influenza A, POC Negative Negative   Influenza B, POC Negative Negative   Negative  for flu.  CXR negative for infiltrate, peribronchial thickening consistent with bronchitis.  Pt was told to hold abx until next 2-3 days if not improving.  Pt had significant improvement after duoneb. Pt can continue albuterol inhaler at home.  Prednisone burst given.  Rest and hydrate.  HO given.   Marland KitchenHarlon Flor PA-C, have reviewed and agree with the above documentation in it's entirety.

## 2018-12-07 NOTE — Patient Instructions (Signed)
Tylenol cold sinus severe.    Acute Bronchitis, Adult Acute bronchitis is when air tubes (bronchi) in the lungs suddenly get swollen. The condition can make it hard to breathe. It can also cause these symptoms:  A cough.  Coughing up clear, yellow, or green mucus.  Wheezing.  Chest congestion.  Shortness of breath.  A fever.  Body aches.  Chills.  A sore throat. Follow these instructions at home:  Medicines  Take over-the-counter and prescription medicines only as told by your doctor.  If you were prescribed an antibiotic medicine, take it as told by your doctor. Do not stop taking the antibiotic even if you start to feel better. General instructions  Rest.  Drink enough fluids to keep your pee (urine) pale yellow.  Avoid smoking and secondhand smoke. If you smoke and you need help quitting, ask your doctor. Quitting will help your lungs heal faster.  Use an inhaler, cool mist vaporizer, or humidifier as told by your doctor.  Keep all follow-up visits as told by your doctor. This is important. How is this prevented? To lower your risk of getting this condition again:  Wash your hands often with soap and water. If you cannot use soap and water, use hand sanitizer.  Avoid contact with people who have cold symptoms.  Try not to touch your hands to your mouth, nose, or eyes.  Make sure to get the flu shot every year. Contact a doctor if:  Your symptoms do not get better in 2 weeks. Get help right away if:  You cough up blood.  You have chest pain.  You have very bad shortness of breath.  You become dehydrated.  You faint (pass out) or keep feeling like you are going to pass out.  You keep throwing up (vomiting).  You have a very bad headache.  Your fever or chills gets worse. This information is not intended to replace advice given to you by your health care provider. Make sure you discuss any questions you have with your health care  provider. Document Released: 03/24/2008 Document Revised: 05/20/2017 Document Reviewed: 03/26/2016 Elsevier Interactive Patient Education  2019 [employer].

## 2018-12-09 ENCOUNTER — Encounter: Payer: Self-pay | Admitting: Physician Assistant

## 2018-12-09 DIAGNOSIS — R41840 Attention and concentration deficit: Secondary | ICD-10-CM

## 2018-12-10 ENCOUNTER — Other Ambulatory Visit: Payer: Self-pay | Admitting: Physician Assistant

## 2018-12-10 DIAGNOSIS — F3341 Major depressive disorder, recurrent, in partial remission: Secondary | ICD-10-CM

## 2018-12-15 MED ORDER — BUSPIRONE HCL 5 MG PO TABS: 5.0000 mg | ORAL_TABLET | Freq: Three times a day (TID) | ORAL | 0 refills | Status: DC

## 2018-12-15 MED ORDER — HYDROCOD POLST-CPM POLST ER 10-8 MG/5ML PO SUER: 5.0000 mL | Freq: Two times a day (BID) | ORAL | 0 refills | Status: DC | PRN

## 2018-12-17 ENCOUNTER — Ambulatory Visit (INDEPENDENT_AMBULATORY_CARE_PROVIDER_SITE_OTHER): Payer: Self-pay | Admitting: Physician Assistant

## 2018-12-17 ENCOUNTER — Encounter: Payer: Self-pay | Admitting: Physician Assistant

## 2018-12-17 VITALS — BP 121/96 | HR 83 | Temp 98.5°F | Wt 220.0 lb

## 2018-12-17 DIAGNOSIS — F1290 Cannabis use, unspecified, uncomplicated: Secondary | ICD-10-CM

## 2018-12-17 DIAGNOSIS — F419 Anxiety disorder, unspecified: Secondary | ICD-10-CM

## 2018-12-17 DIAGNOSIS — J989 Respiratory disorder, unspecified: Secondary | ICD-10-CM

## 2018-12-17 DIAGNOSIS — R05 Cough: Secondary | ICD-10-CM

## 2018-12-17 DIAGNOSIS — R0989 Other specified symptoms and signs involving the circulatory and respiratory systems: Secondary | ICD-10-CM

## 2018-12-17 DIAGNOSIS — R059 Cough, unspecified: Secondary | ICD-10-CM

## 2018-12-17 NOTE — Progress Notes (Signed)
Subjective:    Patient ID: Michelle Berger, female    DOB: 25-Apr-1986, 33 y.o.   MRN: [id number]  HPI  Pt is a 33 yo female who presents to the clinic to follow up on cough. She has had frequent exacerbations of cough/SOB/Wheezing and bronchitis like symptoms since last fall when she had pneumonia. She comes in today because she is worried her marijuana use is making this worse. She admits to using mariajuana 2-3 times a week for anxiety. She uses a vape/jule pen. She was also adopted at 11 and recently found out her mother had emphysema and COPD.   Her cough is improving but still present. She has prednisone left to finish.   She recently stopped marijuana but now her anxiety is worsening. Xanax makes her too sleepy. She is hesistant to start another medication. She is on wellbutrin and zoloft. She also finds with her anxiety worsening that her focus is terrible. We already have order in for ADHD testing.   .. Active Ambulatory Problems    Diagnosis Date Noted  . Major depression 07/12/2013  . Class 2 obesity due to excess calories without serious comorbidity with body mass index (BMI) of 38.0 to 38.9 in adult 07/12/2013  . Preventive measure 07/12/2013  . Allergic rhinitis 07/12/2013  . History of migraine headaches 01/04/2015  . Tendinitis, de Quervain's 01/04/2015  . PMDD (premenstrual dysphoric disorder) 12/04/2016  . Sebaceous cyst 01/28/2017  . Inattention 01/28/2017  . No energy 01/28/2017  . Recurrent candidiasis of vagina 05/05/2017  . Abnormal weight gain 05/05/2017  . Foreign body in auricle of right ear 05/05/2017  . Motion sickness 11/18/2017  . Plantar fasciitis, right 01/26/2018  . Migraines 05/19/2016  . History of recurrent pneumonia 11/05/2018  . Anxiety 12/22/2018   Resolved Ambulatory Problems    Diagnosis Date Noted  . Acute maxillary sinusitis 11/17/2013   Past Medical History:  Diagnosis Date  . Depression   . Headache       Review of  Systems    see HPI.  Objective:   Physical Exam Vitals signs reviewed.  Constitutional:      Appearance: Normal appearance. She is obese.  HENT:     Head: Normocephalic.     Nose: Nose normal.     Mouth/Throat:     Pharynx: Oropharynx is clear.  Cardiovascular:     Rate and Rhythm: Normal rate and regular rhythm.     Pulses: Normal pulses.  Pulmonary:     Effort: Pulmonary effort is normal.     Breath sounds: Normal breath sounds.  Neurological:     General: No focal deficit present.     Mental Status: She is alert and oriented to person, place, and time.  Psychiatric:        Mood and Affect: Mood normal.           Assessment & Plan:  Marland KitchenMarland KitchenCrystal was seen today for cough.  Diagnoses and all orders for this visit:  Reactive airway disease that is not asthma  Anxiety  Marijuana use, episodic  Cough  Other orders -     AMBULATORY NON FORMULARY MEDICATION; Nebulizer machine with accessories.  pt has no symptoms of asthma/lung disease when not sick. Certainly vaping could be bringing about more exacerbations. If she is going to use for anxiety she needs to ingest another way. There could also be an allergy component to cough. Start zyrtec and singulair. If no improvmement consider pepcid.   Pt likes to  use neb when sick. Will order one.   She responded well but 2 weeks of symbicort with last flare. Restart your sample.   When feeling stable come in for spirometry.   Discussed anxiety. Consider counseling and exercise. We could increase zoloft as well. You are on a very low dose. ADD testing ordered. Follow up in 4 weeks.   [name].Spent 30 minutes with patient and greater than 50 percent of visit spent counseling patient regarding treatment plan.

## 2018-12-17 NOTE — Patient Instructions (Signed)
Zyrtec daily.  Singulair, consider.  Pepcid, 20mg  twice a day, consider.   Will send nebulizer.   Start symbicort.

## 2018-12-22 ENCOUNTER — Encounter: Payer: Self-pay | Admitting: Physician Assistant

## 2018-12-22 DIAGNOSIS — R059 Cough, unspecified: Secondary | ICD-10-CM | POA: Insufficient documentation

## 2018-12-22 DIAGNOSIS — F1290 Cannabis use, unspecified, uncomplicated: Secondary | ICD-10-CM | POA: Insufficient documentation

## 2018-12-22 DIAGNOSIS — F419 Anxiety disorder, unspecified: Secondary | ICD-10-CM | POA: Insufficient documentation

## 2018-12-22 DIAGNOSIS — R05 Cough: Secondary | ICD-10-CM | POA: Insufficient documentation

## 2018-12-22 DIAGNOSIS — R0989 Other specified symptoms and signs involving the circulatory and respiratory systems: Secondary | ICD-10-CM | POA: Insufficient documentation

## 2018-12-22 DIAGNOSIS — J989 Respiratory disorder, unspecified: Secondary | ICD-10-CM | POA: Insufficient documentation

## 2018-12-22 MED ORDER — AMBULATORY NON FORMULARY MEDICATION: 0 refills | Status: DC

## 2018-12-22 NOTE — Telephone Encounter (Signed)
Is there another options aka downstairs that could screen for ADD/ADHD in adult for reasonable price?

## 2018-12-23 NOTE — Telephone Encounter (Signed)
Downstairs does not do any of the testing she could call [facility] and see what the cost would be there but just like [place] I would think it would be pricy as well. - CF

## 2018-12-24 MED ORDER — AMPHETAMINE-DEXTROAMPHET ER 20 MG PO CP24: 20.0000 mg | ORAL_CAPSULE | ORAL | 0 refills | Status: DC

## 2018-12-24 NOTE — Addendum Note (Signed)
Addended by: Jomarie Longs on: 12/24/2018 08:31 AM   Modules accepted: Orders

## 2019-02-02 ENCOUNTER — Encounter: Payer: Self-pay | Admitting: Physician Assistant

## 2019-03-10 ENCOUNTER — Telehealth: Payer: Self-pay | Admitting: Physician Assistant

## 2019-03-10 NOTE — Telephone Encounter (Signed)
Pt scheduled for OV eval tomorrow

## 2019-03-10 NOTE — Telephone Encounter (Signed)
Intense coughing with green mucus

## 2019-03-11 ENCOUNTER — Ambulatory Visit (INDEPENDENT_AMBULATORY_CARE_PROVIDER_SITE_OTHER): Payer: Self-pay | Admitting: Physician Assistant

## 2019-03-11 ENCOUNTER — Encounter: Payer: Self-pay | Admitting: Physician Assistant

## 2019-03-11 VITALS — BP 118/73 | HR 74 | Temp 98.6°F | Ht 64.0 in | Wt 218.0 lb

## 2019-03-11 DIAGNOSIS — R0989 Other specified symptoms and signs involving the circulatory and respiratory systems: Secondary | ICD-10-CM

## 2019-03-11 DIAGNOSIS — J329 Chronic sinusitis, unspecified: Secondary | ICD-10-CM

## 2019-03-11 DIAGNOSIS — J989 Respiratory disorder, unspecified: Secondary | ICD-10-CM

## 2019-03-11 DIAGNOSIS — J40 Bronchitis, not specified as acute or chronic: Secondary | ICD-10-CM

## 2019-03-11 MED ORDER — IPRATROPIUM-ALBUTEROL 0.5-2.5 (3) MG/3ML IN SOLN: 3.0000 mL | RESPIRATORY_TRACT | 0 refills | Status: DC | PRN

## 2019-03-11 MED ORDER — PREDNISONE 50 MG PO TABS: ORAL_TABLET | ORAL | 0 refills | Status: DC

## 2019-03-11 MED ORDER — HYDROCOD POLST-CPM POLST ER 10-8 MG/5ML PO SUER: 5.0000 mL | Freq: Two times a day (BID) | ORAL | 0 refills | Status: DC | PRN

## 2019-03-11 MED ORDER — AZITHROMYCIN 250 MG PO TABS: ORAL_TABLET | ORAL | 0 refills | Status: DC

## 2019-03-11 NOTE — Progress Notes (Signed)
..Virtual Visit via Video Note  I connected with Michelle Berger on 03/11/19 at 10:50 AM EDT by a video enabled telemedicine application and verified that I am speaking with the correct person using two identifiers.  Location: Patient: home Provider: clinic   I discussed the limitations of evaluation and management by telemedicine and the availability of in person appointments. The patient expressed understanding and agreed to proceed.  History of Present Illness: Pt is a 33 yo female with hx of episodes of bronchitis/reactive airway disease and pneumonia. She denies any fever, chills, body aches. She has no direct covid exposure. Her cough is productive for the last 7 days and worsening. She does feel some SOB. She has tried OtC cold medications. She is not using albuterol inhaler.   .. Active Ambulatory Problems    Diagnosis Date Noted  . Major depression 07/12/2013  . Class 2 obesity due to excess calories without serious comorbidity with body mass index (BMI) of 38.0 to 38.9 in adult 07/12/2013  . Preventive measure 07/12/2013  . Allergic rhinitis 07/12/2013  . History of migraine headaches 01/04/2015  . Tendinitis, de Quervain's 01/04/2015  . PMDD (premenstrual dysphoric disorder) 12/04/2016  . Sebaceous cyst 01/28/2017  . Inattention 01/28/2017  . No energy 01/28/2017  . Recurrent candidiasis of vagina 05/05/2017  . Abnormal weight gain 05/05/2017  . Foreign body in auricle of right ear 05/05/2017  . Motion sickness 11/18/2017  . Plantar fasciitis, right 01/26/2018  . Migraines 05/19/2016  . History of recurrent pneumonia 11/05/2018  . Anxiety 12/22/2018  . 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 Cough 12/22/2018   Resolved Ambulatory Problems    Diagnosis Date Noted  . Acute maxillary sinusitis 11/17/2013   Past Medical History:  Diagnosis Date  . Depression   . Headache    Reviewed med, allergy,  problem list.     Observations/Objective: No acute distress. Normal appearance. Normal mood.  Productive cough on exam.   .. Today's Vitals   03/11/19 1029  BP: 118/73  Pulse: 74  Temp: 98.6 F (37 C)  TempSrc: Oral  Weight: 218 lb (98.9 kg)  Height: 5\' 4"  (1.626 m)   Body mass index is 37.42 kg/m.    Assessment and Plan: Marland KitchenMarland KitchenCrystal was seen today for cough.  Diagnoses and all orders for this visit:  Sinobronchitis -     chlorpheniramine-HYDROcodone (TUSSIONEX) 10-8 MG/5ML SUER; Take 5 mLs by mouth every 12 (twelve) hours as needed. -     azithromycin (ZITHROMAX) 250 MG tablet; Take 2 tablets now and then one tablet for 4 days. -     predniSONE (DELTASONE) 50 MG tablet; One tab PO daily for 5 days.  Reactive airway disease that is not asthma -     ipratropium-albuterol (DUONEB) 0.5-2.5 (3) MG/3ML SOLN; Take 3 mLs by nebulization every 4 (four) hours as needed. -     predniSONE (DELTASONE) 50 MG tablet; One tab PO daily for 5 days.  start zpak and duoneb every 2-4 hours. If not improving or having more SOB and tightness start prednisone. Discussed during COVID pandemic trying to avoid use of prednisone. Pt will only start if not improving. Pt was instructed to go to ED if spikes fever, body aches, and increasing difficulty breathing.  tussionex at bedtime.      Follow Up Instructions:    I discussed the assessment and treatment plan with the patient. The patient was provided an opportunity to  ask questions and all were answered. The patient agreed with the plan and demonstrated an understanding of the instructions.   The patient was advised to call back or seek an in-person evaluation if the symptoms worsen or if the condition fails to improve as anticipated.    Iran Planas, PA-C

## 2019-06-08 ENCOUNTER — Encounter: Payer: Self-pay | Admitting: Physician Assistant

## 2019-06-09 NOTE — Telephone Encounter (Signed)
Jade there is no albuterol on her med list.

## 2019-06-10 MED ORDER — ALBUTEROL SULFATE HFA 108 (90 BASE) MCG/ACT IN AERS: 2.0000 | INHALATION_SPRAY | Freq: Four times a day (QID) | RESPIRATORY_TRACT | 1 refills | Status: DC | PRN

## 2019-07-21 ENCOUNTER — Telehealth: Payer: Self-pay

## 2019-07-21 MED ORDER — FLUCONAZOLE 150 MG PO TABS: 150.0000 mg | ORAL_TABLET | Freq: Once | ORAL | 0 refills | Status: AC

## 2019-07-21 NOTE — Telephone Encounter (Signed)
Patient called- going out of town tomorrow and has yeast infection. Requesting medication be called in. She tried to make appt with [facility] but no openings.  Pended Fluconazole. OK to send and advise pt to make appt with another provider next week if not improving?

## 2019-07-21 NOTE — Telephone Encounter (Signed)
Pt advised.

## 2019-07-21 NOTE — Telephone Encounter (Signed)
Sent!

## 2019-08-29 ENCOUNTER — Other Ambulatory Visit: Payer: Self-pay

## 2019-08-29 ENCOUNTER — Ambulatory Visit (INDEPENDENT_AMBULATORY_CARE_PROVIDER_SITE_OTHER): Payer: Self-pay | Admitting: Physician Assistant

## 2019-08-29 ENCOUNTER — Encounter: Payer: Self-pay | Admitting: Physician Assistant

## 2019-08-29 ENCOUNTER — Ambulatory Visit: Payer: Self-pay | Admitting: Physician Assistant

## 2019-08-29 VITALS — Temp 97.6°F | Ht 64.0 in | Wt 218.0 lb

## 2019-08-29 DIAGNOSIS — F99 Mental disorder, not otherwise specified: Secondary | ICD-10-CM

## 2019-08-29 DIAGNOSIS — F5105 Insomnia due to other mental disorder: Secondary | ICD-10-CM

## 2019-08-29 DIAGNOSIS — F3341 Major depressive disorder, recurrent, in partial remission: Secondary | ICD-10-CM

## 2019-08-29 DIAGNOSIS — J329 Chronic sinusitis, unspecified: Secondary | ICD-10-CM

## 2019-08-29 DIAGNOSIS — F419 Anxiety disorder, unspecified: Secondary | ICD-10-CM

## 2019-08-29 DIAGNOSIS — J40 Bronchitis, not specified as acute or chronic: Secondary | ICD-10-CM

## 2019-08-29 MED ORDER — TRAZODONE HCL 50 MG PO TABS: 25.0000 mg | ORAL_TABLET | Freq: Every evening | ORAL | 1 refills | Status: DC | PRN

## 2019-08-29 MED ORDER — PREDNISONE 50 MG PO TABS: ORAL_TABLET | ORAL | 0 refills | Status: DC

## 2019-08-29 MED ORDER — AZITHROMYCIN 250 MG PO TABS: ORAL_TABLET | ORAL | 0 refills | Status: DC

## 2019-08-29 MED ORDER — DIAZEPAM 5 MG PO TABS: 5.0000 mg | ORAL_TABLET | Freq: Two times a day (BID) | ORAL | 1 refills | Status: DC | PRN

## 2019-08-29 NOTE — Progress Notes (Deleted)
Changed to virtual - patient has cough Very irritable/ stressed/ can't sleep [id number] completed.

## 2019-08-29 NOTE — Progress Notes (Signed)
Patient ID: Michelle Berger, female   DOB: [birth year], 33 y.o.   MRN: [id number] .Marland KitchenVirtual Visit via Telephone Note  I connected with Michelle Berger on 08/29/19 at 10:30 AM EST by telephone and verified that I am speaking with the correct person using two identifiers.  Location: Patient: home Provider: clinic   I discussed the limitations, risks, security and privacy concerns of performing an evaluation and management service by telephone and the availability of in person appointments. I also discussed with the patient that there may be a patient responsible charge related to this service. The patient expressed understanding and agreed to proceed.   History of Present Illness: Patient is a 33 year old female with history of depression and anxiety as well as reactive airway disease calls into the clinic with upper respiratory symptoms and worsening mood.  She actually called in on Friday after feeling like her mental health was declining.  For the last month or so she has been very anxious.  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  She called while sitting in the [employer] parking lot crying.  She just feels overwhelmed and like she is going to lose it.  She has had frequent episodes of anger outburst.  She is taking her Zoloft and Wellbutrin daily.  She is having a lot of problems sleeping.  She denies any suicidal or homicidal thoughts. She does not like xanax but has tried valium in the past and did well.   Patient has had sinus pressure and symptoms for the last 5 to 6 days.  Last night it seemed to go into her chest.  She does have a history of pneumonia and bronchitis.  She does not regularly use her albuterol inhaler but does feel the need to use it today.  No fever, chills, body aches.  She is very tired.  Her husband has similar symptoms.  He was recently in [place].  No loss of smell or taste and no GI symptoms.  She does feel little shortness  of breath some chest tightness and wheezing.  Her cough is productive.  She is using over-the-counter care.   .. Active Ambulatory Problems    Diagnosis Date Noted  . Major depression 07/12/2013  . Class 2 obesity due to excess calories without serious comorbidity with body mass index (BMI) of 38.0 to 38.9 in adult 07/12/2013  . Preventive measure 07/12/2013  . Allergic rhinitis 07/12/2013  . History of migraine headaches 01/04/2015  . Tendinitis, de Quervain's 01/04/2015  . PMDD (premenstrual dysphoric disorder) 12/04/2016  . Sebaceous cyst 01/28/2017  . Inattention 01/28/2017  . No energy 01/28/2017  . Recurrent candidiasis of vagina 05/05/2017  . Abnormal weight gain 05/05/2017  . Foreign body in auricle of right ear 05/05/2017  . Motion sickness 11/18/2017  . Plantar fasciitis, right 01/26/2018  . Migraines 05/19/2016  . History of recurrent pneumonia 11/05/2018  . Anxiety 12/22/2018  . 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 Cough 12/22/2018   Resolved Ambulatory Problems    Diagnosis Date Noted  . Acute maxillary sinusitis 11/17/2013   Past Medical History:  Diagnosis Date  . Depression   . Headache    Reviewed med, allergy, problem list.     Observations/Objective: No acute distress.  Productive cough on phone.  No labored breathing.   .. Today's Vitals   08/29/19 1005  Temp: 97.6 F (36.4 C)  TempSrc: Oral  Weight: 218 lb (98.9 kg)  Height: 5\' 4"  (1.626 m)   Body mass index is 37.42 kg/m. .. Depression screen [facility] 2/9 08/29/2019 07/28/2018 11/18/2017 04/28/2017 01/26/2017  Decreased Interest 0 0 2 0 2  Down, Depressed, Hopeless 0 0 0 0 2  PHQ - 2 Score 0 0 2 0 4  Altered sleeping - 1 0 2 3  Tired, decreased energy - 2 3 2 3   Change in appetite - 0 0 0 2  Feeling bad or failure about yourself  - 3 0 0 0  Trouble concentrating - 0 3 2 3   Moving slowly or fidgety/restless - 0 0 0 2  Suicidal  thoughts - 0 0 0 0  PHQ-9 Score - 6 8 6 17   Difficult doing work/chores - Somewhat difficult - - -   .03/28/2017 GAD 7 : Generalized Anxiety Score 08/29/2019 07/28/2018 11/18/2017  Nervous, Anxious, on Edge 3 2 1   Control/stop worrying 3 2 0  Worry too much - different things 3 2 0  Trouble relaxing 3 2 1   Restless 3 0 0  Easily annoyed or irritable 3 2 1   Afraid - awful might happen 1 1 1   Total GAD 7 Score 19 11 4   Anxiety Difficulty Very difficult - -       Assessment and Plan: [name]13/9/2020[name] was seen today for follow-up.  Diagnoses and all orders for this visit:  Sinobronchitis -     azithromycin (ZITHROMAX) 250 MG tablet; Take 2 tablets now and then one tablet for 4 days. -     predniSONE (DELTASONE) 50 MG tablet; Take one tablet for 5 days.  Recurrent major depressive disorder, in partial remission (HCC)  Anxiety -     diazepam (VALIUM) 5 MG tablet; Take 1 tablet (5 mg total) by mouth every 12 (twelve) hours as needed for anxiety.  Insomnia due to other mental disorder -     traZODone (DESYREL) 50 MG tablet; Take 0.5-1 tablets (25-50 mg total) by mouth at bedtime as needed for sleep.   Pt needs covid testing and to self isolate until results. Discussed [facility] through. Not given for work. Likely sinobronchitis that patient gets yearly. Sent zpak and prednisone. Use albuterol has needed.   Stress/anxiety/depression have increased. Increase zoloft to 50mg . Stay on wellbutrin. Added trazodone for sleep. Discussed SE. Valium for as needed. Follow up in 6 weeks. Discussed walking/mediatating/counseling.    Follow Up Instructions:    I discussed the assessment and treatment plan with the patient. The patient was provided an opportunity to ask questions and all were answered. The patient agreed with the plan and demonstrated an understanding of the instructions.   The patient was advised to call back or seek an in-person evaluation if the symptoms worsen or if the  condition fails to improve as anticipated.  I provided 20 minutes of non-face-to-face time during this encounter.   09/27/2018, PA-C

## 2019-09-02 ENCOUNTER — Telehealth: Payer: Self-pay | Admitting: Physician Assistant

## 2019-09-02 ENCOUNTER — Encounter: Payer: Self-pay | Admitting: Physician Assistant

## 2019-09-02 DIAGNOSIS — J069 Acute upper respiratory infection, unspecified: Secondary | ICD-10-CM

## 2019-09-02 NOTE — Telephone Encounter (Signed)
We just had one this week. Ill just add from there.

## 2019-09-02 NOTE — Progress Notes (Signed)
I spent 10 min on this E-Visit.   Based on what you shared with me, I feel your condition warrants further evaluation and I recommend that you be seen for a face to face visit. You have been of antibiotics, steroids, and currently on albuterol with minimal improvement.  Please contact your primary care physician practice to be seen. Many offices offer virtual options to be seen via video if you are not comfortable going in person to a medical facility at this time.  If you do not have a PCP, [facility] offers a free physician referral service available at [phone]. Our trained staff has the experience, knowledge and resources to put you in touch with a physician who is right for you.   You also have the option of a video visit through [email or web address]  If you are having a true medical emergency please call 911.  NOTE: If you entered your credit card information for this eVisit, you will not be charged. You may see a "hold" on your card for the $35 but that hold will drop off and you will not have a charge processed.  Your e-visit answers were reviewed by a board certified advanced clinical practitioner to complete your personal care plan.  Thank you for using e-Visits.

## 2019-11-01 ENCOUNTER — Other Ambulatory Visit: Payer: Self-pay | Admitting: Neurology

## 2019-11-01 MED ORDER — SERTRALINE HCL 50 MG PO TABS: 50.0000 mg | ORAL_TABLET | Freq: Every day | ORAL | 0 refills | Status: DC

## 2019-11-01 NOTE — Telephone Encounter (Signed)
Patient left vm asking for RX for increase of Zoloft. RX sent.

## 2019-11-08 ENCOUNTER — Ambulatory Visit: Payer: Self-pay | Attending: Internal Medicine

## 2019-11-08 DIAGNOSIS — Z20822 Contact with and (suspected) exposure to covid-19: Secondary | ICD-10-CM | POA: Insufficient documentation

## 2019-11-10 LAB — NOVEL CORONAVIRUS, NAA: SARS-CoV-2, NAA: NOT DETECTED

## 2020-01-03 ENCOUNTER — Other Ambulatory Visit: Payer: Self-pay | Admitting: Physician Assistant

## 2020-01-03 DIAGNOSIS — F3341 Major depressive disorder, recurrent, in partial remission: Secondary | ICD-10-CM

## 2020-01-04 ENCOUNTER — Other Ambulatory Visit: Payer: Self-pay | Admitting: Physician Assistant

## 2020-01-04 DIAGNOSIS — F5105 Insomnia due to other mental disorder: Secondary | ICD-10-CM

## 2020-01-04 DIAGNOSIS — F99 Mental disorder, not otherwise specified: Secondary | ICD-10-CM

## 2020-02-01 ENCOUNTER — Encounter: Payer: Self-pay | Admitting: Physician Assistant

## 2020-02-02 ENCOUNTER — Telehealth (INDEPENDENT_AMBULATORY_CARE_PROVIDER_SITE_OTHER): Payer: Self-pay | Admitting: Nurse Practitioner

## 2020-02-02 ENCOUNTER — Encounter: Payer: Self-pay | Admitting: Nurse Practitioner

## 2020-02-02 DIAGNOSIS — J069 Acute upper respiratory infection, unspecified: Secondary | ICD-10-CM

## 2020-02-02 MED ORDER — ALBUTEROL SULFATE HFA 108 (90 BASE) MCG/ACT IN AERS: 1.0000 | INHALATION_SPRAY | RESPIRATORY_TRACT | 1 refills | Status: DC | PRN

## 2020-02-02 MED ORDER — PREDNISONE 50 MG PO TABS: ORAL_TABLET | ORAL | 0 refills | Status: DC

## 2020-02-02 MED ORDER — FLUCONAZOLE 150 MG PO TABS: 150.0000 mg | ORAL_TABLET | Freq: Every day | ORAL | 1 refills | Status: DC

## 2020-02-02 MED ORDER — AZITHROMYCIN 250 MG PO TABS: ORAL_TABLET | ORAL | 0 refills | Status: DC

## 2020-02-02 NOTE — Progress Notes (Signed)
Virtual Visit via MyChart Note  I connected with  Michelle Berger Michelle Berger on 02/02/20 at  8:20 AM EDT by the video enabled telemedicine application, MyChart, and verified that I am speaking with the correct person using two identifiers.   I introduced myself as a Publishing rights manager with the practice. We discussed the limitations of evaluation and management by telemedicine and the availability of in person appointments. The patient expressed understanding and agreed to proceed.  The patient is: at home I am: in the office  Subjective:    CC: cough  HPI: Michelle Berger is a 34 y.o. y/o female presenting via MyChart today for productive cough, ear pain and pressure, and sore throat that started on Thursday of last week. On Tuesday of last week the patient did have surgery and was intubated for this procedure. On Thursday she began to experience productive cough with thick green mucus and a sore throat. She reports the sore throat has resolved considerably and the mucus has decreased at this time. She does feel the cough is getting deeper and she is experiencing shortness of breath with movement. She is congested in her ears are stopped up to the point she has decreased hearing.  She denies redness in her throat, facial pain or pressure, fever at this time.  She is just completing a prescription of Keflex from the surgery. She does have a significant history for recurrent pneumonia for which she is generally treated with 5-day course of azithromycin and prednisone burst. She has been using her albuterol inhaler frequently since the onset of symptoms with some relief.  Past medical history, Surgical history, Family history not pertinant except as noted below, Social history, Allergies, and medications have been entered into the medical record, reviewed, and corrections made.   Review of Systems:  See HPI for pertinent positives and negatives.  Objective:    General: Speaking clearly in  complete sentences without any shortness of breath.  Alert and oriented x3.  Normal judgment. No apparent acute distress. Patient is audibly congested and does have a harsh, wet sounding cough.  Impression and Recommendations:   1. Upper respiratory tract infection, unspecified type Symptoms and presentation consistent with upper respiratory infection likely of bacterial origin given the amount of time symptoms have been present. She does have a recent history of endotracheal intubation and a significant history for recurrent pneumonia therefore will treat prophylactically with 5-day burst of prednisone 50 mg and 5-day course of azithromycin. Patient provided with prescription for refill on albuterol inhaler and instructed to use every 4-6 hours as needed for wheezing, shortness of breath, cough. Patient also provided with prescription for Diflucan as she is already experiencing symptoms of a yeast infection and does have a history of chronic yeast infections. Patient instructed to increase fluid intake, rest, and complete antibiotic treatment. She will notify us if her symptoms worsen or fail to improve throughout the course of the antibiotic therapy. Follow-up if symptoms worsen or fail to improve. - azithromycin (ZITHROMAX) 250 MG tablet; Take 2 tablets now and then one tablet for 4 days.  Dispense: 6 tablet; Refill: 0 - predniSONE (DELTASONE) 50 MG tablet; Take one tablet for 5 days.  Dispense: 5 tablet; Refill: 0 - albuterol (VENTOLIN HFA) 108 (90 Base) MCG/ACT inhaler; Inhale 1-2 puffs into the lungs every 4 (four) hours as needed for wheezing or shortness of breath.  Dispense: 16 g; Refill: 1 - fluconazole (DIFLUCAN) 150 MG tablet; Take 1 tablet (150 mg total) by mouth  daily.  Dispense: 1 tablet; Refill: 1    I discussed the assessment and treatment plan with the patient. The patient was provided an opportunity to ask questions and all were answered. The patient agreed with the plan and  demonstrated an understanding of the instructions.   The patient was advised to call back or seek an in-person evaluation if the symptoms worsen or if the condition fails to improve as anticipated.  I provided 11 minutes of non-face-to-face interaction with this [place] visit.   Orma Render, NP

## 2020-03-05 ENCOUNTER — Other Ambulatory Visit: Payer: Self-pay | Admitting: Physician Assistant

## 2020-03-25 ENCOUNTER — Other Ambulatory Visit: Payer: Self-pay | Admitting: Physician Assistant

## 2020-04-28 ENCOUNTER — Other Ambulatory Visit: Payer: Self-pay | Admitting: Physician Assistant

## 2020-04-28 DIAGNOSIS — F3341 Major depressive disorder, recurrent, in partial remission: Secondary | ICD-10-CM

## 2020-05-02 ENCOUNTER — Ambulatory Visit (INDEPENDENT_AMBULATORY_CARE_PROVIDER_SITE_OTHER): Payer: Self-pay | Admitting: Physician Assistant

## 2020-05-02 ENCOUNTER — Other Ambulatory Visit (HOSPITAL_COMMUNITY)
Admission: RE | Admit: 2020-05-02 | Discharge: 2020-05-02 | Disposition: A | Discharge status: Home / Self Care | Payer: Self-pay | Source: Ambulatory Visit | Attending: Physician Assistant | Admitting: Physician Assistant

## 2020-05-02 ENCOUNTER — Encounter: Payer: Self-pay | Admitting: Physician Assistant

## 2020-05-02 VITALS — BP 119/79 | HR 93 | Ht 64.0 in | Wt 223.0 lb

## 2020-05-02 DIAGNOSIS — Z79899 Other long term (current) drug therapy: Secondary | ICD-10-CM

## 2020-05-02 DIAGNOSIS — F5105 Insomnia due to other mental disorder: Secondary | ICD-10-CM

## 2020-05-02 DIAGNOSIS — F99 Mental disorder, not otherwise specified: Secondary | ICD-10-CM

## 2020-05-02 DIAGNOSIS — Z124 Encounter for screening for malignant neoplasm of cervix: Secondary | ICD-10-CM

## 2020-05-02 DIAGNOSIS — F3341 Major depressive disorder, recurrent, in partial remission: Secondary | ICD-10-CM

## 2020-05-02 DIAGNOSIS — Z0000 Encounter for general adult medical examination without abnormal findings: Secondary | ICD-10-CM

## 2020-05-02 DIAGNOSIS — Z131 Encounter for screening for diabetes mellitus: Secondary | ICD-10-CM

## 2020-05-02 MED ORDER — TRAZODONE HCL 50 MG PO TABS: 25.0000 mg | ORAL_TABLET | Freq: Every evening | ORAL | 3 refills | Status: DC | PRN

## 2020-05-02 MED ORDER — SERTRALINE HCL 50 MG PO TABS: 50.0000 mg | ORAL_TABLET | Freq: Every day | ORAL | 3 refills | Status: DC

## 2020-05-02 MED ORDER — BUPROPION HCL ER (XL) 300 MG PO TB24: 300.0000 mg | ORAL_TABLET | Freq: Every day | ORAL | 3 refills | Status: DC

## 2020-05-02 NOTE — Progress Notes (Signed)
Subjective:    Patient ID: Michelle Berger, female    DOB: Sep 15, 1986, 34 y.o.   MRN: [id number]  HPI  Pt is a 34 yo female who presents to the clinic for annual exam and medication refills.   Mood and sleep are doing great. No concerns or complaints.    .. Active Ambulatory Problems    Diagnosis Date Noted  . Major depression 07/12/2013  . Class 2 obesity due to excess calories without serious comorbidity with body mass index (BMI) of 38.0 to 38.9 in adult 07/12/2013  . Preventive measure 07/12/2013  . Allergic rhinitis 07/12/2013  . History of migraine headaches 01/04/2015  . Tendinitis, de Quervain's 01/04/2015  . PMDD (premenstrual dysphoric disorder) 12/04/2016  . Sebaceous cyst 01/28/2017  . Inattention 01/28/2017  . No energy 01/28/2017  . Recurrent candidiasis of vagina 05/05/2017  . Abnormal weight gain 05/05/2017  . Foreign body in auricle of right ear 05/05/2017  . Motion sickness 11/18/2017  . Plantar fasciitis, right 01/26/2018  . Migraines 05/19/2016  . History of recurrent pneumonia 11/05/2018  . Anxiety 12/22/2018  . 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 Cough 12/22/2018  . 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 Acute maxillary sinusitis 11/17/2013   Past Medical History:  Diagnosis Date  . Depression   . Headache    .Marland KitchenHistory reviewed. No pertinent family history. .. Social History   Socioeconomic History  . Marital status: Married    Spouse name: Not on file  . Number of children: Not on file  . Years of education: Not on file  . Highest education level: Not on file  Occupational History  . Not on file  Tobacco Use  . Smoking status: Never Smoker  . Smokeless tobacco: Never Used  Vaping Use  . Vaping Use: Never used  Substance and Sexual Activity  . Alcohol use: Yes    Alcohol/week: 6.0 standard drinks     Types: 6 Glasses of wine per week  . Drug use: Yes    Types: Marijuana    Comment: Jule or vape pen  . Sexual activity: Yes  Other Topics Concern  . Not on file  Social History Narrative  . Not on file   Social Determinants of Health   Financial Resource Strain:   . Difficulty of Paying Living Expenses:   Food Insecurity:   . Worried About Programme researcher, broadcasting/film/video in the Last Year:   . Barista in the Last Year:   Transportation Needs:   . Freight forwarder (Medical):   [name] Lack of Transportation (Non-Medical):   Physical Activity:   . Days of Exercise per Week:   . Minutes of Exercise per Session:   Stress:   . Feeling of Stress :   Social Connections:   . Frequency of Communication with Friends and Family:   . Frequency of Social Gatherings with Friends and Family:   . Attends Religious Services:   . Active Member of Clubs or Organizations:   . Attends Banker Meetings:   [name] Marital Status:   Intimate Partner Violence:   . Fear of Current or Ex-Partner:   . Emotionally Abused:   [name] Physically Abused:   . Sexually Abused:     Review of Systems  All other systems reviewed and are negative.  Objective:   Physical Exam Vitals reviewed.  Constitutional:      Appearance: Normal appearance.  HENT:     Head: Normocephalic.     Right Ear: Tympanic membrane normal.     Left Ear: Tympanic membrane normal.     Nose: Nose normal.     Mouth/Throat:     Mouth: Mucous membranes are moist.     Pharynx: No oropharyngeal exudate or posterior oropharyngeal erythema.  Eyes:     Extraocular Movements: Extraocular movements intact.     Conjunctiva/sclera: Conjunctivae normal.     Pupils: Pupils are equal, round, and reactive to light.  Cardiovascular:     Rate and Rhythm: Normal rate and regular rhythm.     Pulses: Normal pulses.     Heart sounds: Normal heart sounds.  Pulmonary:     Effort: Pulmonary effort is normal.     Breath sounds: Normal  breath sounds.     Comments: No masses or tenderness with bilateral breast exam.  Abdominal:     General: Bowel sounds are normal.     Palpations: Abdomen is soft.  Genitourinary:    General: Normal vulva.  Musculoskeletal:        General: Normal range of motion.  Lymphadenopathy:     Cervical: No cervical adenopathy.  Neurological:     General: No focal deficit present.     Mental Status: She is alert.  Psychiatric:        Mood and Affect: Mood normal.          .. Depression screen [facility] 2/9 05/02/2020 08/29/2019 07/28/2018 11/18/2017 04/28/2017  Decreased Interest 0 0 0 2 0  Down, Depressed, Hopeless 0 0 0 0 0  PHQ - 2 Score 0 0 0 2 0  Altered sleeping 0 - 1 0 2  Tired, decreased energy 1 - 2 3 2   Change in appetite 3 - 0 0 0  Feeling bad or failure about yourself  0 - 3 0 0  Trouble concentrating 3 - 0 3 2  Moving slowly or fidgety/restless 0 - 0 0 0  Suicidal thoughts 0 - 0 0 0  PHQ-9 Score 7 - 6 8 6   Difficult doing work/chores - - Somewhat difficult - -   . GAD 7 : Generalized Anxiety Score 05/02/2020 08/29/2019 07/28/2018 11/18/2017  Nervous, Anxious, on Edge 0 3 2 1   Control/stop worrying 1 3 2  0  Worry too much - different things 1 3 2  0  Trouble relaxing 0 3 2 1   Restless 0 3 0 0  Easily annoyed or irritable 0 3 2 1   Afraid - awful might happen 1 1 1 1   Total GAD 7 Score 3 19 11 4   Anxiety Difficulty Not difficult at all Very difficult - -     Assessment & Plan:  12/9/20192/1/2019[name] was seen today for annual exam.  Diagnoses and all orders for this visit:  Routine physical examination -     COMPLETE METABOLIC PANEL WITH GFR  Routine Papanicolaou smear -     Cytology - PAP  Recurrent major depressive disorder, in partial remission (HCC) -     buPROPion (WELLBUTRIN XL) 300 MG 24 hr tablet; Take 1 tablet (300 mg total) by mouth daily. -     sertraline (ZOLOFT) 50 MG tablet; Take 1 tablet (50 mg total) by mouth daily.  Insomnia due to other mental disorder -      traZODone (DESYREL) 50 MG tablet; Take 0.5-1 tablets (25-50 mg total)  by mouth at bedtime as needed for sleep.  Medication management -     COMPLETE METABOLIC PANEL WITH GFR  Screening for diabetes mellitus -     COMPLETE METABOLIC PANEL WITH GFR   .[name] Discussed 150 minutes of exercise a week.  Encouraged vitamin D 1000 units and Calcium 1300mg  or 4 servings of dairy a day.  Fasting labs ordered.  Pap done. Declined STD. Medications refilled.

## 2020-05-02 NOTE — Patient Instructions (Signed)
Health Maintenance, Female Adopting a healthy lifestyle and getting preventive care are important in promoting health and wellness. Ask your health care provider about:  The right schedule for you to have regular tests and exams.  Things you can do on your own to prevent diseases and keep yourself healthy. What should I know about diet, weight, and exercise? Eat a healthy diet   Eat a diet that includes plenty of vegetables, fruits, low-fat dairy products, and lean protein.  Do not eat a lot of foods that are high in solid fats, added sugars, or sodium. Maintain a healthy weight Body mass index (BMI) is used to identify weight problems. It estimates body fat based on height and weight. Your health care provider can help determine your BMI and help you achieve or maintain a healthy weight. Get regular exercise Get regular exercise. This is one of the most important things you can do for your health. Most adults should:  Exercise for at least 150 minutes each week. The exercise should increase your heart rate and make you sweat (moderate-intensity exercise).  Do strengthening exercises at least twice a week. This is in addition to the moderate-intensity exercise.  Spend less time sitting. Even light physical activity can be beneficial. Watch cholesterol and blood lipids Have your blood tested for lipids and cholesterol at 35 years of age, then have this test every 5 years. Have your cholesterol levels checked more often if:  Your lipid or cholesterol levels are high.  You are older than 35 years of age.  You are at high risk for heart disease. What should I know about cancer screening? Depending on your health history and family history, you may need to have cancer screening at various ages. This may include screening for:  Breast cancer.  Cervical cancer.  Colorectal cancer.  Skin cancer.  Lung cancer. What should I know about heart disease, diabetes, and high blood  pressure? Blood pressure and heart disease  High blood pressure causes heart disease and increases the risk of stroke. This is more likely to develop in people who have high blood pressure readings, are of African descent, or are overweight.  Have your blood pressure checked: ? Every 3-5 years if you are 18-39 years of age. ? Every year if you are 40 years old or older. Diabetes Have regular diabetes screenings. This checks your fasting blood sugar level. Have the screening done:  Once every three years after age 40 if you are at a normal weight and have a low risk for diabetes.  More often and at a younger age if you are overweight or have a high risk for diabetes. What should I know about preventing infection? Hepatitis B If you have a higher risk for hepatitis B, you should be screened for this virus. Talk with your health care provider to find out if you are at risk for hepatitis B infection. Hepatitis C Testing is recommended for:  Everyone born from 1945 through 1965.  Anyone with known risk factors for hepatitis C. Sexually transmitted infections (STIs)  Get screened for STIs, including gonorrhea and chlamydia, if: ? You are sexually active and are younger than 35 years of age. ? You are older than 35 years of age and your health care provider tells you that you are at risk for this type of infection. ? Your sexual activity has changed since you were last screened, and you are at increased risk for chlamydia or gonorrhea. Ask your health care provider if   you are at risk.  Ask your health care provider about whether you are at high risk for HIV. Your health care provider may recommend a prescription medicine to help prevent HIV infection. If you choose to take medicine to prevent HIV, you should first get tested for HIV. You should then be tested every 3 months for as long as you are taking the medicine. Pregnancy  If you are about to stop having your period (premenopausal) and  you may become pregnant, seek counseling before you get pregnant.  Take 400 to 800 micrograms (mcg) of folic acid every day if you become pregnant.  Ask for birth control (contraception) if you want to prevent pregnancy. Osteoporosis and menopause Osteoporosis is a disease in which the bones lose minerals and strength with aging. This can result in bone fractures. If you are 65 years old or older, or if you are at risk for osteoporosis and fractures, ask your health care provider if you should:  Be screened for bone loss.  Take a calcium or vitamin D supplement to lower your risk of fractures.  Be given hormone replacement therapy (HRT) to treat symptoms of menopause. Follow these instructions at home: Lifestyle  Do not use any products that contain nicotine or tobacco, such as cigarettes, e-cigarettes, and chewing tobacco. If you need help quitting, ask your health care provider.  Do not use street drugs.  Do not share needles.  Ask your health care provider for help if you need support or information about quitting drugs. Alcohol use  Do not drink alcohol if: ? Your health care provider tells you not to drink. ? You are pregnant, may be pregnant, or are planning to become pregnant.  If you drink alcohol: ? Limit how much you use to 0-1 drink a day. ? Limit intake if you are breastfeeding.  Be aware of how much alcohol is in your drink. In the [place] one drink equals one 12 oz bottle of beer (355 mL), one 5 oz glass of wine (148 mL), or one 1 oz glass of hard liquor (44 mL). General instructions  Schedule regular health, dental, and eye exams.  Stay current with your vaccines.  Tell your health care provider if: ? You often feel depressed. ? You have ever been abused or do not feel safe at home. Summary  Adopting a healthy lifestyle and getting preventive care are important in promoting health and wellness.  Follow your health care provider's instructions about healthy  diet, exercising, and getting tested or screened for diseases.  Follow your health care provider's instructions on monitoring your cholesterol and blood pressure. This information is not intended to replace advice given to you by your health care provider. Make sure you discuss any questions you have with your health care provider. Document Revised: 09/29/2018 Document Reviewed: 09/29/2018 Elsevier Patient Education  2020 [employer] Inc.  

## 2020-05-03 LAB — COMPLETE METABOLIC PANEL WITH GFR
AG Ratio: 1.7 (calc) (ref 1.0–2.5)
ALT: 22 U/L (ref 6–29)
AST: 26 U/L (ref 10–30)
Albumin: 4.5 g/dL (ref 3.6–5.1)
Alkaline phosphatase (APISO): 87 U/L (ref 31–125)
BUN: 11 mg/dL (ref 7–25)
CO2: 23 mmol/L (ref 20–32)
Calcium: 9.2 mg/dL (ref 8.6–10.2)
Chloride: 104 mmol/L (ref 98–110)
Creat: 0.84 mg/dL (ref 0.50–1.10)
GFR, Est African American: 105 mL/min/{1.73_m2} (ref >=60)
GFR, Est Non African American: 91 mL/min/{1.73_m2} (ref >=60)
Globulin: 2.7 g/dL (calc) (ref 1.9–3.7)
Glucose, Bld: 82 mg/dL (ref 65–99)
Potassium: 4 mmol/L (ref 3.5–5.3)
Sodium: 137 mmol/L (ref 135–146)
Total Bilirubin: 0.5 mg/dL (ref 0.2–1.2)
Total Protein: 7.2 g/dL (ref 6.1–8.1)

## 2020-05-03 NOTE — Progress Notes (Signed)
Perfect

## 2020-05-04 DIAGNOSIS — F5105 Insomnia due to other mental disorder: Secondary | ICD-10-CM | POA: Insufficient documentation

## 2020-05-04 LAB — CYTOLOGY - PAP
Comment: NEGATIVE
Diagnosis: NEGATIVE
Diagnosis: REACTIVE
High risk HPV: NEGATIVE

## 2020-05-04 NOTE — Progress Notes (Signed)
Normal cells and negative HPV ok to go 5 years for next pap.

## 2020-06-05 ENCOUNTER — Ambulatory Visit: Payer: Self-pay | Admitting: Physician Assistant

## 2020-07-20 ENCOUNTER — Encounter: Payer: Self-pay | Admitting: Physician Assistant

## 2020-07-20 ENCOUNTER — Ambulatory Visit (INDEPENDENT_AMBULATORY_CARE_PROVIDER_SITE_OTHER): Payer: Self-pay | Admitting: Physician Assistant

## 2020-07-20 VITALS — BP 112/72 | HR 94 | Temp 98.7°F | Ht 64.0 in | Wt 231.0 lb

## 2020-07-20 DIAGNOSIS — J989 Respiratory disorder, unspecified: Secondary | ICD-10-CM

## 2020-07-20 DIAGNOSIS — J40 Bronchitis, not specified as acute or chronic: Secondary | ICD-10-CM

## 2020-07-20 DIAGNOSIS — J329 Chronic sinusitis, unspecified: Secondary | ICD-10-CM

## 2020-07-20 MED ORDER — AMOXICILLIN-POT CLAVULANATE 875-125 MG PO TABS: 1.0000 | ORAL_TABLET | Freq: Two times a day (BID) | ORAL | 0 refills | Status: DC

## 2020-07-20 MED ORDER — PREDNISONE 20 MG PO TABS: ORAL_TABLET | ORAL | 0 refills | Status: DC

## 2020-07-20 MED ORDER — HYDROCOD POLST-CPM POLST ER 10-8 MG/5ML PO SUER: 5.0000 mL | Freq: Two times a day (BID) | ORAL | 0 refills | Status: DC | PRN

## 2020-07-20 NOTE — Progress Notes (Addendum)
Subjective:    Patient ID: Michelle Berger, female    DOB: 1986/08/06, 34 y.o.   MRN: [id number]  HPI  Michelle Berger is a 34 year old female presenting for chest congestion and cough that has been going on for the past two weeks. It is not improving and is keeping her up at night. She is taking her albuterol inhaler multiple times daily to combat the chest tightness and feelings of shortness of breath which does help some. She had some prednisone leftover from a different bought of reactive airway that she took on Tuesday 07/17/2020 which did help to lessen her symptoms. In addition to the cough, she states that she is also having sinus pressure and pain that has continued for the last two weeks. She denies palpitations, chest pain, diarrhea, nausea, vomiting, fever and chills. covid home test negative.   .. Active Ambulatory Problems    Diagnosis Date Noted  . Major depression 07/12/2013  . Class 2 obesity due to excess calories without serious comorbidity with body mass index (BMI) of 38.0 to 38.9 in adult 07/12/2013  . Preventive measure 07/12/2013  . Allergic rhinitis 07/12/2013  . History of migraine headaches 01/04/2015  . Tendinitis, de Quervain's 01/04/2015  . PMDD (premenstrual dysphoric disorder) 12/04/2016  . Sebaceous cyst 01/28/2017  . Inattention 01/28/2017  . No energy 01/28/2017  . Recurrent candidiasis of vagina 05/05/2017  . Abnormal weight gain 05/05/2017  . Foreign body in auricle of right ear 05/05/2017  . Motion sickness 11/18/2017  . Plantar fasciitis, right 01/26/2018  . Migraines 05/19/2016  . History of recurrent pneumonia 11/05/2018  . Anxiety 12/22/2018  . 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 Cough 12/22/2018  . 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 Acute maxillary sinusitis 11/17/2013   Past Medical History:  Diagnosis Date   . Depression   . Headache      Review of Systems  Constitutional: Negative for chills, diaphoresis and fever.  HENT: Positive for congestion.   Respiratory: Positive for cough, chest tightness and shortness of breath.   Cardiovascular: Negative for chest pain, palpitations and leg swelling.  Gastrointestinal: Negative for abdominal pain, blood in stool, diarrhea, nausea and vomiting.  Genitourinary: Negative for dysuria and urgency.       Objective:   Physical Exam Vitals reviewed.  Constitutional:      Appearance: She is well-developed.  HENT:     Head: Normocephalic and atraumatic.     Comments: Tenderness over maxillary sinuses to palpation.     Right Ear: Hearing, tympanic membrane and external ear normal. Laceration present.     Left Ear: Hearing, tympanic membrane, ear canal and external ear normal.  Neck:     Thyroid: No thyromegaly.  Cardiovascular:     Rate and Rhythm: Normal rate and regular rhythm.     Heart sounds: Normal heart sounds.  Pulmonary:     Effort: Pulmonary effort is normal.     Breath sounds: Normal breath sounds.     Comments: No wheezing or rhonchi. Stridor with inspiration.  Musculoskeletal:     Cervical back: Normal range of motion and neck supple.  Lymphadenopathy:     Cervical: No cervical adenopathy.  Neurological:     Mental Status: She is alert.          Assessment & Plan:  Marland KitchenMarland KitchenCrystal was seen today for chest pain  and cough.  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  Sinobronchitis -     predniSONE (DELTASONE) 20 MG tablet; Take 3 tablets for 3 days, take 2 tablets for 3 days, take 1 tablet for 3 days, take 1/2 tablet for 4 days. -     amoxicillin-clavulanate (AUGMENTIN) 875-125 MG tablet; Take 1 tablet by mouth 2 (two) times daily. -      chlorpheniramine-HYDROcodone (TUSSIONEX) 10-8 MG/5ML SUER; Take 5 mLs by mouth every 12 (twelve) hours as needed.   Pt has hx of reactive airway with URI's/infections. Symptoms going on for 2 weeks. Negative covid test. Treated with augmentin/prednisone/cough syrup. Rest and hydrate. Continue to use albuterol if needed.   Marland KitchenHarlon Flor PA-C, have reviewed and agree with the above documentation in it's entirety.

## 2020-07-23 ENCOUNTER — Encounter: Payer: Self-pay | Admitting: Physician Assistant

## 2020-09-07 ENCOUNTER — Other Ambulatory Visit: Payer: Self-pay

## 2020-09-07 ENCOUNTER — Ambulatory Visit (INDEPENDENT_AMBULATORY_CARE_PROVIDER_SITE_OTHER): Payer: Self-pay

## 2020-09-07 ENCOUNTER — Ambulatory Visit (INDEPENDENT_AMBULATORY_CARE_PROVIDER_SITE_OTHER): Payer: Self-pay | Admitting: Physician Assistant

## 2020-09-07 ENCOUNTER — Encounter: Payer: Self-pay | Admitting: Physician Assistant

## 2020-09-07 VITALS — BP 119/67 | HR 74 | Temp 97.9°F | Ht 64.0 in | Wt 229.0 lb

## 2020-09-07 DIAGNOSIS — J4541 Moderate persistent asthma with (acute) exacerbation: Secondary | ICD-10-CM

## 2020-09-07 DIAGNOSIS — J302 Other seasonal allergic rhinitis: Secondary | ICD-10-CM

## 2020-09-07 DIAGNOSIS — R059 Cough, unspecified: Secondary | ICD-10-CM

## 2020-09-07 DIAGNOSIS — J45909 Unspecified asthma, uncomplicated: Secondary | ICD-10-CM

## 2020-09-07 MED ORDER — PREDNISONE 20 MG PO TABS: ORAL_TABLET | ORAL | 0 refills | Status: DC

## 2020-09-07 MED ORDER — BUDESONIDE-FORMOTEROL FUMARATE 160-4.5 MCG/ACT IN AERO: 2.0000 | INHALATION_SPRAY | Freq: Two times a day (BID) | RESPIRATORY_TRACT | 1 refills | Status: DC

## 2020-09-07 NOTE — Patient Instructions (Signed)
Asthma and Physical Activity Physical activity is an important part of a healthy lifestyle. If you have asthma, it is important to exercise because physical activity can help you to:  Control your asthma.  Maintain your weight or lose weight.  Increase your energy.  Decrease stress and anxiety.  Lower your risk of getting sick.  Improve your heart health. However, asthma symptoms can flare up when you are physically active or exercising. You can learn how to control your asthma and prevent symptoms during exercise. This will help you to remain physically active. How can asthma affect my ability to be physically active? When you have asthma, physical activity can cause you to have symptoms such as:  Wheezing. This may sound like whistling while breathing.  A feeling of tightness in the chest.  Sore throat.  Coughing.  Shortness of breath.  Tiredness (fatigue) with minimal activity.  Increased sputum production.  Chest pain. What actions can I take to prevent asthma problems during physical activity? Pulmonary rehabilitation Enroll in a pulmonary rehabilitation program. Benefits of this type of program include:  Education on lung diseases.  Classes that teach you how to exercise and be more active while decreasing your shortness of breath.  A group setting that allows you to talk with others who have asthma. Asthma action plan Follow the asthma action plan set by your health care provider. Your personal asthma plan may include:  Taking your medicines as told by your health care provider.  Avoiding your asthma triggers, except physical activity. Triggers may include cold air, dust, pollen, pet dander, and air pollution.  Tracking your asthma control.  Using a peak flow meter.  Being aware of worsening symptoms.  Knowing when to seek emergency care. Proper breathing During exercise, follow these tips for proper breathing:  Breathe in before starting the exercise  and breathe out during the hardest part of the exercise.  Take slow breaths.  Pace yourself and do not try to go too fast.  While breathing out, purse your lips. Before beginning any exercise program or new activity, talk with your health care provider. Medicines If physical activity triggers your asthma, your health care provider may order the following medicines:  A rescue inhaler (short-acting beta2-agonist) for you to use shortly before physical activity or exercise. Its effects may reduce exercise-related symptoms for 2-3 hours.  A long-acting beta2-agonist that can offer up to 12 hours of relief if taken daily.  Leukotriene modifiers. These pills are taken several hours before physical activity or exercise to help prevent asthma symptoms that are caused by exercise.  Long-term control medicines. These will be given if you have severe or frequent asthma symptoms during or after exercise. These symptoms may also mean that your asthma is not well controlled.  General information  Exercise indoors when the air is dry or during allergy season.  Try to breathe in warm, moist air by wearing a scarf over your nose and mouth or breathing only through your nose.  Spend a few minutes warming up before your workout.  Cool down after exercise. What should I do if my asthma symptoms get worse? Contact your health care provider if your asthma symptoms are getting worse. Your asthma is getting worse if:  You have symptoms more often.  Your symptoms are more severe.  Your symptoms get worse at night and make you lose sleep.  Your peak flow number is lower than your personal best or changes a lot from day to day.  Your asthma medicines do not work as well as they used to.  You use your rescue inhaler more often. If you use your rescue inhaler more than 2 days a week, your asthma is not well controlled.  You go to the emergency room or see your health care provider because of an asthma  attack. Where to find support  Ask your health care provider about signing up for a pulmonary rehabilitation program.  Ask your health care provider about asthma support groups.  Visit your local community health department.  Check out local hospitals' community health programs. Where can I get more information?  Your health care provider.  American Lung Association: lung.org  National Heart, Lung, and Blood Institute: [employer].glnhlbi.nih.gov Contact a health care provider if:  You have trouble walking and talking because you are out of breath. Get help right away if:  Your lips or fingernails are blue.  You are not able to breathe or catch your breath. Summary  Physical activity is an important part of a healthy lifestyle. However, if you have asthma, your symptoms can flare up during exercise or physical activity.  You can prevent problems during physical activity by doing pulmonary rehabilitation, following an asthma action plan, doing proper breathing, and using medicines.  Talk with your health care provider before starting any exercise program or new activity. This information is not intended to replace advice given to you by your health care provider. Make sure you discuss any questions you have with your health care provider. Document Revised: 01/25/2019 Document Reviewed: 12/22/2017 Elsevier Patient Education  2020 Elsevier Inc. Asthma, Adult  Asthma is a long-term (chronic) condition in which the airways get tight and narrow. The airways are the breathing passages that lead from the nose and mouth down into the lungs. A person with asthma will have times when symptoms get worse. These are called asthma attacks. They can cause coughing, whistling sounds when you breathe (wheezing), shortness of breath, and chest pain. They can make it hard to breathe. There is no cure for asthma, but medicines and lifestyle changes can help control it. There are many things that can bring on an  asthma attack or make asthma symptoms worse (triggers). Common triggers include:  Mold.  Dust.  Cigarette smoke.  Cockroaches.  Things that can cause allergy symptoms (allergens). These include animal skin flakes (dander) and pollen from trees or grass.  Things that pollute the air. These may include household cleaners, wood smoke, smog, or chemical odors.  Cold air, weather changes, and wind.  Crying or laughing hard.  Stress.  Certain medicines or drugs.  Certain foods such as dried fruit, potato chips, and grape juice.  Infections, such as a cold or the flu.  Certain medical conditions or diseases.  Exercise or tiring activities. Asthma may be treated with medicines and by staying away from the things that cause asthma attacks. Types of medicines may include:  Controller medicines. These help prevent asthma symptoms. They are usually taken every day.  Fast-acting reliever or rescue medicines. These quickly relieve asthma symptoms. They are used as needed and provide short-term relief.  Allergy medicines if your attacks are brought on by allergens.  Medicines to help control the body's defense (immune) system. Follow these instructions at home: Avoiding triggers in your home  Change your heating and air conditioning filter often.  Limit your use of fireplaces and wood stoves.  Get rid of pests (such as roaches and mice) and their droppings.  Throw away  plants if you see mold on them.  Clean your floors. Dust regularly. Use cleaning products that do not smell.  Have someone vacuum when you are not home. Use a vacuum cleaner with a HEPA filter if possible.  Replace carpet with wood, tile, or vinyl flooring. Carpet can trap animal skin flakes and dust.  Use allergy-proof pillows, mattress covers, and box spring covers.  Wash bed sheets and blankets every week in hot water. Dry them in a dryer.  Keep your bedroom free of any triggers.  Avoid pets and keep  windows closed when things that cause allergy symptoms are in the air.  Use blankets that are made of polyester or cotton.  Clean bathrooms and kitchens with bleach. If possible, have someone repaint the walls in these rooms with mold-resistant paint. Keep out of the rooms that are being cleaned and painted.  Wash your hands often with soap and water. If soap and water are not available, use hand sanitizer.  Do not allow anyone to smoke in your home. General instructions  Take over-the-counter and prescription medicines only as told by your doctor. ? Talk with your doctor if you have questions about how or when to take your medicines. ? Make note if you need to use your medicines more often than usual.  Do not use any products that contain nicotine or tobacco, such as cigarettes and e-cigarettes. If you need help quitting, ask your doctor.  Stay away from secondhand smoke.  Avoid doing things outdoors when allergen counts are high and when air quality is low.  Wear a ski mask when doing outdoor activities in the winter. The mask should cover your nose and mouth. Exercise indoors on cold days if you can.  Warm up before you exercise. Take time to cool down after exercise.  Use a peak flow meter as told by your doctor. A peak flow meter is a tool that measures how well the lungs are working.  Keep track of the peak flow meter's readings. Write them down.  Follow your asthma action plan. This is a written plan for taking care of your asthma and treating your attacks.  Make sure you get all the shots (vaccines) that your doctor recommends. Ask your doctor about a flu shot and a pneumonia shot.  Keep all follow-up visits as told by your doctor. This is important. Contact a doctor if:  You have wheezing, shortness of breath, or a cough even while taking medicine to prevent attacks.  The mucus you cough up (sputum) is thicker than usual.  The mucus you cough up changes from clear or  white to yellow, green, gray, or bloody.  You have problems from the medicine you are taking, such as: ? A rash. ? Itching. ? Swelling. ? Trouble breathing.  You need reliever medicines more than 2-3 times a week.  Your peak flow reading is still at 50-79% of your personal best after following the action plan for 1 hour.  You have a fever. Get help right away if:  You seem to be worse and are not responding to medicine during an asthma attack.  You are short of breath even at rest.  You get short of breath when doing very little activity.  You have trouble eating, drinking, or talking.  You have chest pain or tightness.  You have a fast heartbeat.  Your lips or fingernails start to turn blue.  You are light-headed or dizzy, or you faint.  Your peak flow is  less than 50% of your personal best.  You feel too tired to breathe normally. Summary  Asthma is a long-term (chronic) condition in which the airways get tight and narrow. An asthma attack can make it hard to breathe.  Asthma cannot be cured, but medicines and lifestyle changes can help control it.  Make sure you understand how to avoid triggers and how and when to use your medicines. This information is not intended to replace advice given to you by your health care provider. Make sure you discuss any questions you have with your health care provider. Document Revised: 12/09/2018 Document Reviewed: 11/10/2016 Elsevier Patient Education  2020 [employer].

## 2020-09-07 NOTE — Progress Notes (Deleted)
L 

## 2020-09-10 NOTE — Progress Notes (Signed)
Malai,   No pneumonia. Just need prednisone only.

## 2020-09-11 DIAGNOSIS — J4541 Moderate persistent asthma with (acute) exacerbation: Secondary | ICD-10-CM | POA: Insufficient documentation

## 2020-09-11 DIAGNOSIS — J45909 Unspecified asthma, uncomplicated: Secondary | ICD-10-CM | POA: Insufficient documentation

## 2020-09-11 DIAGNOSIS — J4540 Moderate persistent asthma, uncomplicated: Secondary | ICD-10-CM | POA: Insufficient documentation

## 2020-09-11 NOTE — Progress Notes (Signed)
Subjective:    Patient ID: Michelle Berger, female    DOB: [birth year], 34 y.o.   MRN: [id number]  HPI  Patient is a 34 year old female with history of childhood asthma and seasonal allergies with reactive airway disease who presents to the clinic with persistent cough, chest tightness, shortness of breath.  She is ultimately been feeling like this for the past month or so.  She was seen in the clinic in October and treated with antibiotic and prednisone.  She does get better but then symptoms have been slowly coming back.  She has a lot of running nose and intermittent nasal congestion, cough and chest tightness.  She is using her albuterol inhaler which gives her rescue.  She is using this multiple times a day.  She feels like she cannot exercise like she would like to.  She denies any fever, chills, body aches, loss of smell or taste. She is taking zyrtec and flonase daily which helps some.   .. Active Ambulatory Problems    Diagnosis Date Noted  . Major depression 07/12/2013  . Class 2 obesity due to excess calories without serious comorbidity with body mass index (BMI) of 38.0 to 38.9 in adult 07/12/2013  . Preventive measure 07/12/2013  . Allergic rhinitis 07/12/2013  . History of migraine headaches 01/04/2015  . Tendinitis, de Quervain's 01/04/2015  . PMDD (premenstrual dysphoric disorder) 12/04/2016  . Sebaceous cyst 01/28/2017  . Inattention 01/28/2017  . No energy 01/28/2017  . Recurrent candidiasis of vagina 05/05/2017  . Abnormal weight gain 05/05/2017  . Foreign body in auricle of right ear 05/05/2017  . Motion sickness 11/18/2017  . Plantar fasciitis, right 01/26/2018  . Migraines 05/19/2016  . History of recurrent pneumonia 11/05/2018  . Anxiety 12/22/2018  . 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 Cough 12/22/2018  . 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 Childhood asthma 09/11/2020  . Moderate  persistent asthma with acute exacerbation 09/11/2020   Resolved Ambulatory Problems    Diagnosis Date Noted  . Acute maxillary sinusitis 11/17/2013   Past Medical History:  Diagnosis Date  . Depression   . Headache      Review of Systems See HPI.     Objective:   Physical Exam Vitals reviewed.  Constitutional:      Appearance: Normal appearance. She is obese.  HENT:     Head: Normocephalic.  Cardiovascular:     Rate and Rhythm: Normal rate and regular rhythm.     Pulses: Normal pulses.     Heart sounds: Normal heart sounds.  Pulmonary:     Effort: Pulmonary effort is normal. No respiratory distress.     Breath sounds: Wheezing present.     Comments: Bilateral wheezing.  Neurological:     General: No focal deficit present.     Mental Status: She is alert and oriented to person, place, and time.  Psychiatric:        Mood and Affect: Mood normal.           Assessment & Plan:  Marland KitchenMarland KitchenCrystal was seen today for asthma.  Diagnoses and all orders for this visit:  Moderate persistent asthma with acute exacerbation -     DG Chest 2 View -     predniSONE (DELTASONE) 20 MG tablet; Take 3 tablets for 3 days, take 2 tablets for 3 days, take 1 tablet for 3 days, take 1/2 tablet for 4 days. -  budesonide-formoterol (SYMBICORT) 160-4.5 MCG/ACT inhaler; Inhale 2 puffs into the lungs 2 (two) times daily. -     Ambulatory referral to Allergy  Childhood asthma, unspecified asthma severity, unspecified whether complicated, unspecified whether persistent  Cough -     DG Chest 2 View -     predniSONE (DELTASONE) 20 MG tablet; Take 3 tablets for 3 days, take 2 tablets for 3 days, take 1 tablet for 3 days, take 1/2 tablet for 4 days. -     budesonide-formoterol (SYMBICORT) 160-4.5 MCG/ACT inhaler; Inhale 2 puffs into the lungs 2 (two) times daily. -     Ambulatory referral to Allergy  Seasonal allergic rhinitis, unspecified trigger -     Ambulatory referral to  Allergy   Bilateral lung wheezing consistent with asthma.  Patient has a history of childhood asthma.  She has a history in our office recurrent reactive airway disease.  Her symptoms are usually present in the fall which means she may just need to increase therapy in the fall.  We will get chest x-ray to rule out any underlying pneumonia and need for antibiotic.  Right now we will start taper of prednisone and Symbicort.  Continue antihistamine and flonase daily and albuterol/DuoNeb.  Concern for some specific triggers in her every day life will send to allergy for testing.

## 2020-10-11 ENCOUNTER — Other Ambulatory Visit: Payer: Self-pay

## 2020-10-11 ENCOUNTER — Encounter: Payer: Self-pay | Admitting: Allergy and Immunology

## 2020-10-11 ENCOUNTER — Ambulatory Visit (INDEPENDENT_AMBULATORY_CARE_PROVIDER_SITE_OTHER): Payer: Self-pay | Admitting: Allergy and Immunology

## 2020-10-11 VITALS — BP 122/70 | HR 90 | Temp 98.6°F | Resp 18 | Ht 64.25 in | Wt 240.4 lb

## 2020-10-11 DIAGNOSIS — J3089 Other allergic rhinitis: Secondary | ICD-10-CM

## 2020-10-11 DIAGNOSIS — J4540 Moderate persistent asthma, uncomplicated: Secondary | ICD-10-CM

## 2020-10-11 MED ORDER — AZELASTINE HCL 0.1 % NA SOLN: NASAL | 5 refills | Status: DC

## 2020-10-11 NOTE — Assessment & Plan Note (Signed)
   Increase Symbicort 160-4.5 g to 2 inhalations twice daily.  To maximize pulmonary deposition, a spacer has been provided along with instructions for its proper administration with an HFA inhaler.  Continue albuterol HFA, 1 to 2 inhalations every 4-6 hours if needed and 15 minutes prior to exercise.  Subjective and objective measures of pulmonary function will be followed and the treatment plan will be adjusted accordingly.

## 2020-10-11 NOTE — Patient Instructions (Addendum)
Moderate persistent asthma  Increase Symbicort 160-4.5 g to 2 inhalations twice daily.  To maximize pulmonary deposition, a spacer has been provided along with instructions for its proper administration with an HFA inhaler.  Continue albuterol HFA, 1 to 2 inhalations every 4-6 hours if needed and 15 minutes prior to exercise.  Subjective and objective measures of pulmonary function will be followed and the treatment plan will be adjusted accordingly.  Seasonal and perennial allergic rhinitis  Aeroallergen avoidance measures have been discussed and provided in written form.  A prescription has been provided for azelastine nasal spray, 1-2 sprays per nostril 2 times daily as needed. Proper nasal spray technique has been discussed and demonstrated.   Nasal saline lavage (NeilMed) has been recommended as needed and prior to medicated nasal sprays along with instructions for proper administration.  For thick post nasal drainage, nasal congestion, and/or sinus pressure, add guaifenesin 1200 mg (Mucinex Maximum Strength) plus/minus pseudoephedrine 120 mg  twice daily as needed with adequate hydration as discussed. Pseudoephedrine is only to be used for short-term relief of nasal/sinus congestion. Long-term use is discouraged due to potential side effects.  If allergen avoidance measures and medications fail to adequately relieve symptoms, aeroallergen immunotherapy will be considered.   Return in about 2 months (around 12/12/2020), or if symptoms worsen or fail to improve.  Control of Dust Mite Allergen  House dust mites play a major role in allergic asthma and rhinitis.  They occur in environments with high humidity wherever human skin, the food for dust mites is found. High levels have been detected in dust obtained from mattresses, pillows, carpets, upholstered furniture, bed covers, clothes and soft toys.  The principal allergen of the house dust mite is found in its feces.  A gram of dust  may contain 1,000 mites and 250,000 fecal particles.  Mite antigen is easily measured in the air during house cleaning activities.    1. Encase mattresses, including the box spring, and pillow, in an air tight cover.  Seal the zipper end of the encased mattresses with wide adhesive tape. 2. Wash the bedding in water of 130 degrees Farenheit weekly.  Avoid cotton comforters/quilts and flannel bedding: the most ideal bed covering is the dacron comforter. 3. Remove all upholstered furniture from the bedroom. 4. Remove carpets, carpet padding, rugs, and non-washable window drapes from the bedroom.  Wash drapes weekly or use plastic window coverings. 5. Remove all non-washable stuffed toys from the bedroom.  Wash stuffed toys weekly. 6. Have the room cleaned frequently with a vacuum cleaner and a damp dust-mop.  The patient should not be in a room which is being cleaned and should wait 1 hour after cleaning before going into the room. 7. Close and seal all heating outlets in the bedroom.  Otherwise, the room will become filled with dust-laden air.  An electric heater can be used to heat the room. Reduce indoor humidity to less than 50%.  Do not use a humidifier.   Reducing Pollen Exposure  The American Academy of Allergy, Asthma and Immunology suggests the following steps to reduce your exposure to pollen during allergy seasons.    1. Do not hang sheets or clothing out to dry; pollen may collect on these items. 2. Do not mow lawns or spend time around freshly cut grass; mowing stirs up pollen. 3. Keep windows closed at night.  Keep car windows closed while driving. 4. Minimize morning activities outdoors, a time when pollen counts are usually at their highest.  5. Stay indoors as much as possible when pollen counts or humidity is high and on windy days when pollen tends to remain in the air longer. 6. Use air conditioning when possible.  Many air conditioners have filters that trap the pollen  spores. 7. Use a HEPA room air filter to remove pollen form the indoor air you breathe.   Control of Dog or Cat Allergen  Avoidance is the best way to manage a dog or cat allergy. If you have a dog or cat and are allergic to dog or cats, consider removing the dog or cat from the home. If you have a dog or cat but don't want to find it a new home, or if your family wants a pet even though someone in the household is allergic, here are some strategies that may help keep symptoms at bay:  1. Keep the pet out of your bedroom and restrict it to only a few rooms. Be advised that keeping the dog or cat in only one room will not limit the allergens to that room. 2. Don't pet, hug or kiss the dog or cat; if you do, wash your hands with soap and water. 3. High-efficiency particulate air (HEPA) cleaners run continuously in a bedroom or living room can reduce allergen levels over time. 4. Place electrostatic material sheet in the air inlet vent in the bedroom. 5. Regular use of a high-efficiency vacuum cleaner or a central vacuum can reduce allergen levels. 6. Giving your dog or cat a bath at least once a week can reduce airborne allergen.   Control of Mold Allergen  Mold and fungi can grow on a variety of surfaces provided certain temperature and moisture conditions exist.  Outdoor molds grow on plants, decaying vegetation and soil.  The major outdoor mold, Alternaria and Cladosporium, are found in very high numbers during hot and dry conditions.  Generally, a late Summer - Fall peak is seen for common outdoor fungal spores.  Rain will temporarily lower outdoor mold spore count, but counts rise rapidly when the rainy period ends.  The most important indoor molds are Aspergillus and Penicillium.  Dark, humid and poorly ventilated basements are ideal sites for mold growth.  The next most common sites of mold growth are the bathroom and the kitchen.  Outdoor [employer] 1. Use air conditioning and keep  windows closed 2. Avoid exposure to decaying vegetation. 3. Avoid leaf raking. 4. Avoid grain handling. 5. Consider wearing a face mask if working in moldy areas.  Indoor Mold Control 1. Maintain humidity below 50%. 2. Clean washable surfaces with 5% bleach solution. 3. Remove sources e.g. Contaminated carpets.

## 2020-10-11 NOTE — Assessment & Plan Note (Signed)
   Aeroallergen avoidance measures have been discussed and provided in written form.  A prescription has been provided for azelastine nasal spray, 1-2 sprays per nostril 2 times daily as needed. Proper nasal spray technique has been discussed and demonstrated.   Nasal saline lavage (NeilMed) has been recommended as needed and prior to medicated nasal sprays along with instructions for proper administration.  For thick post nasal drainage, nasal congestion, and/or sinus pressure, add guaifenesin 1200 mg (Mucinex Maximum Strength) plus/minus pseudoephedrine 120 mg  twice daily as needed with adequate hydration as discussed. Pseudoephedrine is only to be used for short-term relief of nasal/sinus congestion. Long-term use is discouraged due to potential side effects.  If allergen avoidance measures and medications fail to adequately relieve symptoms, aeroallergen immunotherapy will be considered.

## 2020-10-11 NOTE — Progress Notes (Signed)
New Patient Note  RE: Michelle Berger MRN: [id number] DOB: 1985-11-14 Date of Office Visit: 10/11/2020  Referring provider: Nolene Ebbs Primary care provider: Jomarie Longs, PA-C  Chief Complaint: Asthma and Allergic Rhinitis    History of present illness: Michelle Berger is a 34 y.o. female seen today in consultation requested by Tandy Gaw, PA-C.  She reports that she had exercise-induced bronchospasm as an adolescent, however over the past 2 years she has had progressive asthma symptoms.  She has required prednisone for exacerbations 6 times over the past 12 months.  She reports that prednisone "seems to help the most."  Approximately 3 weeks ago, she started taking Symbicort 1 inhalation daily on a consistent basis and notes that her lower respiratory symptoms have improved significantly, though she still experiences some chest tightness and shortness of breath.  Prior to 3 weeks ago, her asthma symptoms were frequent and included chest tightness, dyspnea, and wheezing which "sounds like a faraway train whistle."  She notes that approximately 3 weeks ago she also shaved the 4 dogs that she has in her home and believes that this may have contributed to improvement in the asthma.  Besides dogs, other asthma triggers include rapid weather changes.  She reports that her asthma symptoms improve significantly when she goes to the beach. Michelle Berger experiences nasal congestion and sinus pressure over the cheekbones.  These nasal/sinus symptoms occur year-round but are most frequent and severe during the fall.  Assessment and plan: Moderate persistent asthma  Increase Symbicort 160-4.5 g to 2 inhalations twice daily.  To maximize pulmonary deposition, a spacer has been provided along with instructions for its proper administration with an HFA inhaler.  Continue albuterol HFA, 1 to 2 inhalations every 4-6 hours if needed and 15 minutes prior to  exercise.  Subjective and objective measures of pulmonary function will be followed and the treatment plan will be adjusted accordingly.  Seasonal and perennial allergic rhinitis  Aeroallergen avoidance measures have been discussed and provided in written form.  A prescription has been provided for azelastine nasal spray, 1-2 sprays per nostril 2 times daily as needed. Proper nasal spray technique has been discussed and demonstrated.   Nasal saline lavage (NeilMed) has been recommended as needed and prior to medicated nasal sprays along with instructions for proper administration.  For thick post nasal drainage, nasal congestion, and/or sinus pressure, add guaifenesin 1200 mg (Mucinex Maximum Strength) plus/minus pseudoephedrine 120 mg  twice daily as needed with adequate hydration as discussed. Pseudoephedrine is only to be used for short-term relief of nasal/sinus congestion. Long-term use is discouraged due to potential side effects.  If allergen avoidance measures and medications fail to adequately relieve symptoms, aeroallergen immunotherapy will be considered.   Meds ordered this encounter  Medications  . azelastine (ASTELIN) 0.1 % nasal spray    Sig: 1-2 sprays each nostril 1-2 times daily    Dispense:  30 mL    Refill:  5    Diagnostics: Spirometry:  Allergy skin testing: FVC was 3.76 L and FEV1 was 2.83 L (91% predicted with an FEV1 ratio of 90%.  There was 230 mL postbronchodilator improvement.  This study was performed while the patient was asymptomatic.  Please see scanned spirometry results for details. Environmental skin testing: Positive to grass pollen, weed pollen, ragweed pollen, tree pollen, molds, cat hair, dog epithelia, and dust mite antigen.   Physical examination: Blood pressure 122/70, pulse 90, temperature 98.6 F (37 C), temperature source  Tympanic, resp. rate 18, height 5' 4.25" (1.632 m), weight 240 lb 6.4 oz (109 kg), SpO2 97 %.  General: Alert,  interactive, in no acute distress. HEENT: TMs pearly gray, turbinates moderately edematous with clear discharge, post-pharynx mildly erythematous. Neck: Supple without lymphadenopathy. Lungs: Clear to auscultation without wheezing, rhonchi or rales. CV: Normal S1, S2 without murmurs. Abdomen: Nondistended, nontender. Skin: Warm and dry, without lesions or rashes. Extremities:  No clubbing, cyanosis or edema. Neuro:   Grossly intact.  Review of systems:  Review of systems negative except as noted in HPI / PMHx or noted below: Review of Systems  Constitutional: Negative.   HENT: Negative.   Eyes: Negative.   Respiratory: Negative.   Cardiovascular: Negative.   Gastrointestinal: Negative.   Genitourinary: Negative.   Musculoskeletal: Negative.   Skin: Negative.   Neurological: Negative.   Endo/Heme/Allergies: Negative.   Psychiatric/Behavioral: Negative.     Past medical history:  Past Medical History:  Diagnosis Date  . Asthma   . Depression   . Headache   . Recurrent upper respiratory infection (URI)     Past surgical history:  Past Surgical History:  Procedure Laterality Date  . LIPOSUCTION MULTIPLE BODY PARTS N/A    abdomen and back  . tummy tuck      Family history: Family History  Problem Relation Age of Onset  . Allergic rhinitis Neg Hx   . Angioedema Neg Hx   . Asthma Neg Hx   . Atopy Neg Hx   . Eczema Neg Hx   . Immunodeficiency Neg Hx   . Urticaria Neg Hx     Social history: Social History   Socioeconomic History  . Marital status: Married    Spouse name: Not on file  . Number of children: Not on file  . Years of education: Not on file  . Highest education level: Not on file  Occupational History  . Not on file  Tobacco Use  . Smoking status: Never Smoker  . Smokeless tobacco: Never Used  Vaping Use  . Vaping Use: Never used  Substance and Sexual Activity  . Alcohol use: Yes    Alcohol/week: 6.0 standard drinks    Types: 6 Glasses of  wine per week  . Drug use: Yes    Types: Marijuana    Comment: Jule or vape pen  . Sexual activity: Yes  Other Topics Concern  . Not on file  Social History Narrative  . Not on file   Social Determinants of Health   Financial Resource Strain: Not on file  Food Insecurity: Not on file  Transportation Needs: Not on file  Physical Activity: Not on file  Stress: Not on file  Social Connections: Not on file  Intimate Partner Violence: Not on file    Environmental History: The patient lives in a house built in 1997 with carpeting in the bedroom, central air and heat.  There is no known mold/water damage in the home.  There are 4 dogs in the home which do not have access to her bedroom.  She is a non-smoker.  Current Outpatient Medications  Medication Sig Dispense Refill  . albuterol (VENTOLIN HFA) 108 (90 Base) MCG/ACT inhaler Inhale 1-2 puffs into the lungs every 4 (four) hours as needed for wheezing or shortness of breath. 16 g 1  . budesonide-formoterol (SYMBICORT) 160-4.5 MCG/ACT inhaler Inhale 2 puffs into the lungs 2 (two) times daily. (Patient taking differently: Inhale 1 puff into the lungs daily.) 10.2 each 1  .  buPROPion (WELLBUTRIN XL) 300 MG 24 hr tablet Take 1 tablet (300 mg total) by mouth daily. 90 tablet 3  . diazepam (VALIUM) 5 MG tablet Take 1 tablet (5 mg total) by mouth every 12 (twelve) hours as needed for anxiety. 30 tablet 1  . ipratropium-albuterol (DUONEB) 0.5-2.5 (3) MG/3ML SOLN Take 3 mLs by nebulization every 4 (four) hours as needed. 360 mL 0  . sertraline (ZOLOFT) 50 MG tablet Take 1 tablet (50 mg total) by mouth daily. 90 tablet 3  . azelastine (ASTELIN) 0.1 % nasal spray 1-2 sprays each nostril 1-2 times daily 30 mL 5  . traZODone (DESYREL) 50 MG tablet Take 0.5-1 tablets (25-50 mg total) by mouth at bedtime as needed for sleep. (Patient not taking: Reported on 10/11/2020) 90 tablet 3   No current facility-administered medications for this visit.     Known medication allergies: Allergies  Allergen Reactions  . Sulfa Antibiotics Swelling    I appreciate the opportunity to take part in Nalina's care. Please do not hesitate to contact me with questions.  Sincerely,   R. Jorene Guest, MD

## 2020-11-02 ENCOUNTER — Ambulatory Visit (INDEPENDENT_AMBULATORY_CARE_PROVIDER_SITE_OTHER): Payer: Self-pay

## 2020-11-02 ENCOUNTER — Encounter: Payer: Self-pay | Admitting: Physician Assistant

## 2020-11-02 ENCOUNTER — Ambulatory Visit (INDEPENDENT_AMBULATORY_CARE_PROVIDER_SITE_OTHER): Payer: Self-pay | Admitting: Physician Assistant

## 2020-11-02 ENCOUNTER — Other Ambulatory Visit: Payer: Self-pay

## 2020-11-02 VITALS — BP 126/71 | HR 92 | Wt 244.9 lb

## 2020-11-02 DIAGNOSIS — J4540 Moderate persistent asthma, uncomplicated: Secondary | ICD-10-CM

## 2020-11-02 DIAGNOSIS — R059 Cough, unspecified: Secondary | ICD-10-CM

## 2020-11-02 DIAGNOSIS — Z01818 Encounter for other preprocedural examination: Secondary | ICD-10-CM

## 2020-11-02 DIAGNOSIS — T7840XS Allergy, unspecified, sequela: Secondary | ICD-10-CM

## 2020-11-02 DIAGNOSIS — T7840XA Allergy, unspecified, initial encounter: Secondary | ICD-10-CM | POA: Insufficient documentation

## 2020-11-02 MED ORDER — MONTELUKAST SODIUM 10 MG PO TABS: 10.0000 mg | ORAL_TABLET | Freq: Every day | ORAL | 3 refills | Status: DC

## 2020-11-02 NOTE — Progress Notes (Signed)
Subjective:    Patient ID: Michelle Berger, female    DOB: [birth year], 35 y.o.   MRN: [id number]  HPI  Patient is a 35 year old obese female with multiple allergies and asthma who presents to the clinic for surgical clearance for a staple bariatric surgery planned for tomorrow.  She does update me on her allergy appointment.  She was found to be allergic to numerous different allergies.  They have suggested allergy shots for her.  She is considering.  They did increase her Symbicort to 2 puffs twice a day.  Since then she is getting a headache every day.  She wonders if it is the Symbicort.  Her wheezing is better but she is continues to have a cough.  She denies any fever, chills, body aches, nausea, vomiting, diarrhea.   .. Active Ambulatory Problems    Diagnosis Date Noted  . Major depression 07/12/2013  . Class 2 obesity due to excess calories without serious comorbidity with body mass index (BMI) of 38.0 to 38.9 in adult 07/12/2013  . Preventive measure 07/12/2013  . Seasonal and perennial allergic rhinitis 07/12/2013  . History of migraine headaches 01/04/2015  . Tendinitis, de Quervain's 01/04/2015  . PMDD (premenstrual dysphoric disorder) 12/04/2016  . Sebaceous cyst 01/28/2017  . Inattention 01/28/2017  . No energy 01/28/2017  . Recurrent candidiasis of vagina 05/05/2017  . Abnormal weight gain 05/05/2017  . Foreign body in auricle of right ear 05/05/2017  . Motion sickness 11/18/2017  . Plantar fasciitis, right 01/26/2018  . Migraines 05/19/2016  . History of recurrent pneumonia 11/05/2018  . Anxiety 12/22/2018  . 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 Cough 12/22/2018  . 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 Childhood asthma 09/11/2020  . Moderate persistent asthma 09/11/2020  . Allergies 11/02/2020   Resolved Ambulatory Problems    Diagnosis Date Noted  . Acute maxillary sinusitis 11/17/2013  . Reactive 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 asthma  12/22/2018   Past Medical History:  Diagnosis Date  . Asthma   . Depression   . Headache   . Recurrent upper respiratory infection (URI)        Review of Systems See HPI.     Objective:   Physical Exam Vitals reviewed.  Constitutional:      Appearance: Normal appearance. She is obese.  Cardiovascular:     Rate and Rhythm: Normal rate and regular rhythm.     Pulses: Normal pulses.     Heart sounds: Normal heart sounds.  Pulmonary:     Effort: Pulmonary effort is normal.     Breath sounds: Normal breath sounds.  Musculoskeletal:     Right lower leg: No edema.     Left lower leg: No edema.  Neurological:     Mental Status: She is alert.           Assessment & Plan:  Marland KitchenMarland KitchenDiagnoses and all orders for this visit:  Preop examination -     DG Chest 2 View  Moderate persistent asthma, unspecified whether complicated -     montelukast (SINGULAIR) 10 MG tablet; Take 1 tablet (10 mg total) by mouth at bedtime.  Cough -     montelukast (SINGULAIR) 10 MG tablet; Take 1 tablet (10 mg total) by mouth at bedtime. -     DG Chest 2 View  Allergy, sequela -     montelukast (SINGULAIR) 10 MG tablet; Take 1 tablet (10 mg total) by mouth at bedtime.   Patient states that her surgeon  has done a host of blood work to check her hemoglobin etc.  EKG-NSR, no ischemic changes.  She does having ongoing cough. STAT CXR ordered today. Lungs sound good.  Vitals great.   Back off symbicort to 1 puff bid to see if helps with headache. Added singulair to antihistamine.   Cleared for surgery pending normal CXR.

## 2020-11-02 NOTE — Progress Notes (Signed)
Normal CXR ok for surgery.

## 2020-11-02 NOTE — Patient Instructions (Signed)
Decrease symbicort to 1 puff twice a day.  Add singulair.

## 2020-11-06 NOTE — Addendum Note (Signed)
Addended by: Chalmers Cater on: 11/06/2020 08:57 AM   Modules accepted: Orders

## 2020-11-22 ENCOUNTER — Ambulatory Visit: Payer: Self-pay | Admitting: Family

## 2020-11-22 ENCOUNTER — Ambulatory Visit: Payer: Self-pay | Admitting: Allergy

## 2020-11-22 ENCOUNTER — Ambulatory Visit: Payer: Self-pay | Admitting: Family Medicine

## 2020-11-22 NOTE — Progress Notes (Deleted)
Follow Up Note  RE: Zelma Snead MRN: [id number] DOB: 1986/10/09 Date of Office Visit: 11/22/2020  Referring provider: Nolene Ebbs Primary care provider: Jomarie Longs, PA-C  Chief Complaint: No chief complaint on file.  History of Present Illness: I had the pleasure of seeing Leilani Able for a follow up visit at the [facility] on 11/22/2020. She is a 35 y.o. female, who is being followed for asthma and allergic rhinitis. Her previous allergy office visit was on 10/11/2020 with Dr. Nunzio Cobbs. Today is a regular follow up visit.  2021 skin testing positive to grass, weed, ragweed, tree, mold, dust mites, cat, dog, mouse  Moderate persistent asthma  Increase Symbicort 160-4.5 g to 2 inhalations twice daily.  To maximize pulmonary deposition, a spacer has been provided along with instructions for its proper administration with an HFA inhaler.  Continue albuterol HFA, 1 to 2 inhalations every 4-6 hours if needed and 15 minutes prior to exercise.  Subjective and objective measures of pulmonary function will be followed and the treatment plan will be adjusted accordingly.  Seasonal and perennial allergic rhinitis  Aeroallergen avoidance measures have been discussed and provided in written form.  A prescription has been provided for azelastine nasal spray, 1-2 sprays per nostril 2 times daily as needed. Proper nasal spray technique has been discussed and demonstrated.   Nasal saline lavage (NeilMed) has been recommended as needed and prior to medicated nasal sprays along with instructions for proper administration.  For thick post nasal drainage, nasal congestion, and/or sinus pressure, add guaifenesin 1200 mg (Mucinex Maximum Strength) plus/minus pseudoephedrine 120 mg  twice daily as needed with adequate hydration as discussed. Pseudoephedrine is only to be used for short-term relief of nasal/sinus congestion. Long-term use is discouraged  due to potential side effects.  If allergen avoidance measures and medications fail to adequately relieve symptoms, aeroallergen immunotherapy will be considered.   Assessment and Plan: Taela is a 35 y.o. female with: No problem-specific Assessment & Plan notes found for this encounter.  No follow-ups on file.  No orders of the defined types were placed in this encounter.  Lab Orders  No laboratory test(s) ordered today    Diagnostics: Spirometry:  Tracings reviewed. Her effort: {Blank single:19197::"Good reproducible efforts.","It was hard to get consistent efforts and there is a question as to whether this reflects a maximal maneuver.","Poor effort, data can not be interpreted."} FVC: ***L FEV1: ***L, ***% predicted FEV1/FVC ratio: ***% Interpretation: {Blank single:19197::"Spirometry consistent with mild obstructive disease","Spirometry consistent with moderate obstructive disease","Spirometry consistent with severe obstructive disease","Spirometry consistent with possible restrictive disease","Spirometry consistent with mixed obstructive and restrictive disease","Spirometry uninterpretable due to technique","Spirometry consistent with normal pattern","No overt abnormalities noted given today's efforts"}.  Please see scanned spirometry results for details.  Skin Testing: {Blank single:19197::"Select foods","Environmental allergy panel","Environmental allergy panel and select foods","Food allergy panel","None","Deferred due to recent antihistamines use"}. Positive test to: ***. Negative test to: ***.  Results discussed with patient/family.   Medication List:  Current Outpatient Medications  Medication Sig Dispense Refill  . albuterol (VENTOLIN HFA) 108 (90 Base) MCG/ACT inhaler Inhale 1-2 puffs into the lungs every 4 (four) hours as needed for wheezing or shortness of breath. 16 g 1  . azelastine (ASTELIN) 0.1 % nasal spray 1-2 sprays each nostril 1-2 times daily 30 mL 5  .  budesonide-formoterol (SYMBICORT) 160-4.5 MCG/ACT inhaler Inhale 2 puffs into the lungs 2 (two) times daily. (Patient taking differently: Inhale 1 puff into the lungs  daily.) 10.2 each 1  . buPROPion (WELLBUTRIN XL) 300 MG 24 hr tablet Take 1 tablet (300 mg total) by mouth daily. 90 tablet 3  . diazepam (VALIUM) 5 MG tablet Take 1 tablet (5 mg total) by mouth every 12 (twelve) hours as needed for anxiety. 30 tablet 1  . ipratropium-albuterol (DUONEB) 0.5-2.5 (3) MG/3ML SOLN Take 3 mLs by nebulization every 4 (four) hours as needed. 360 mL 0  . montelukast (SINGULAIR) 10 MG tablet Take 1 tablet (10 mg total) by mouth at bedtime. 90 tablet 3  . sertraline (ZOLOFT) 50 MG tablet Take 1 tablet (50 mg total) by mouth daily. 90 tablet 3  . traZODone (DESYREL) 50 MG tablet Take 0.5-1 tablets (25-50 mg total) by mouth at bedtime as needed for sleep. 90 tablet 3   No current facility-administered medications for this visit.   Allergies: Allergies  Allergen Reactions  . Sulfa Antibiotics Swelling   I reviewed her past medical history, social history, family history, and environmental history and no significant changes have been reported from her previous visit.  Review of Systems  Constitutional: Negative for appetite change, chills, fever and unexpected weight change.  HENT: Negative for congestion and rhinorrhea.   Eyes: Negative for itching.  Respiratory: Negative for cough, chest tightness, shortness of breath and wheezing.   Gastrointestinal: Negative for abdominal pain.  Skin: Negative for rash.  Allergic/Immunologic: Positive for environmental allergies.  Neurological: Negative for headaches.   Objective: There were no vitals taken for this visit. There is no height or weight on file to calculate BMI. Physical Exam Vitals and nursing note reviewed.  Constitutional:      Appearance: Normal appearance. She is well-developed.  HENT:     Head: Normocephalic and atraumatic.     Right Ear:  External ear normal.     Left Ear: External ear normal.     Nose: Nose normal.     Mouth/Throat:     Mouth: Mucous membranes are moist.     Pharynx: Oropharynx is clear.  Eyes:     Conjunctiva/sclera: Conjunctivae normal.  Cardiovascular:     Rate and Rhythm: Normal rate and regular rhythm.     Heart sounds: Normal heart sounds. No murmur heard.   Pulmonary:     Effort: Pulmonary effort is normal.     Breath sounds: Normal breath sounds. No wheezing, rhonchi or rales.  Musculoskeletal:     Cervical back: Neck supple.  Skin:    General: Skin is warm.     Findings: No rash.  Neurological:     Mental Status: She is alert and oriented to person, place, and time.  Psychiatric:        Behavior: Behavior normal.    Previous notes and tests were reviewed. The plan was reviewed with the patient/family, and all questions/concerned were addressed.  It was my pleasure to see Temesha today and participate in her care. Please feel free to contact me with any questions or concerns.  Sincerely,  Wyline Mood, DO Allergy & Immunology  [facility] office: [phone] [facility] office: [phone]

## 2021-02-15 ENCOUNTER — Encounter: Payer: Self-pay | Admitting: Family Medicine

## 2021-02-15 ENCOUNTER — Telehealth (INDEPENDENT_AMBULATORY_CARE_PROVIDER_SITE_OTHER): Payer: Self-pay | Admitting: Family Medicine

## 2021-02-15 VITALS — Wt 205.0 lb

## 2021-02-15 DIAGNOSIS — J0140 Acute pansinusitis, unspecified: Secondary | ICD-10-CM

## 2021-02-15 MED ORDER — AMOXICILLIN-POT CLAVULANATE 875-125 MG PO TABS
1.0000 | ORAL_TABLET | Freq: Two times a day (BID) | ORAL | 0 refills | Status: AC
Start: 2021-02-15 — End: 2021-02-20

## 2021-02-15 NOTE — Progress Notes (Signed)
Virtual Video Visit via MyChart Note  I connected with  Michelle Berger on 02/15/21 at  2:00 PM EDT by the video enabled telemedicine application for MyChart, and verified that I am speaking with the correct person using two identifiers.   I introduced myself as a Publishing rights manager with the practice. We discussed the limitations of evaluation and management by telemedicine and the availability of in person appointments. The patient expressed understanding and agreed to proceed.  Participating parties in this visit include: The patient and the nurse practitioner listed.  The patient is: in the car I am: In the office - [facility]  Subjective:    CC:  Chief Complaint  Patient presents with  . Sinusitis    X 5 days.    HPI: Michelle Berger is a 35 y.o. year old female presenting today via MyChart today for sinusitis.  Patient states that on Monday she started feeling some sinus pressure developing after a couple days of cold symptoms, however symptoms have significantly worsened over the past several days. She is having 8/10 pansinusitis pressure and headache with significant pressure behind her eyes and she feels like her cheeks/eyes are puffy. No vision changes or eye pain. Symptoms are constant all day long with minimal relief from Sudafed. She has also had nasal congestion with green mucus, rhinorrhea, postnasal drip causing sore throat, mild occasional cough and significant fatigue. She has not had any chest pain, trouble breathing, n/v/d, loss of taste/smell. Her entire family has recent colds developing into sinus infections and they have all tested negative for COVID.   Past medical history, Surgical history, Family history not pertinant except as noted below, Social history, Allergies, and medications have been entered into the medical record, reviewed, and corrections made.   Review of Systems:  All review of systems negative except what is listed in  the HPI   Objective:    General:  Speaking clearly in complete sentences. Absent shortness of breath noted.   Alert and oriented x3.   Normal judgment.  Absent acute distress.   Impression and Recommendations:    1. Acute non-recurrent pansinusitis Patient with 5-6 days of symptoms that have become quite severe. I will go ahead and send in Augmentin for sinusitis. Encouraged her to wait a couple more days if she can in case this is viral, but if her symptoms worsen or persist another few days then go ahead and start the antibiotic. Continue supportive measures including rest, hydration, humidifier use, warm compresses, steam shower, warm liquids, honey/lemon tea, Mucinex, OTC cold medicines and analgesics. Educated on signs and symptoms that would require further evaluation. Recommend COVID testing.  - amoxicillin-clavulanate (AUGMENTIN) 875-125 MG tablet; Take 1 tablet by mouth 2 (two) times daily for 5 days.  Dispense: 10 tablet; Refill: 0   Follow-up if symptoms worsen or fail to improve.    I discussed the assessment and treatment plan with the patient. The patient was provided an opportunity to ask questions and all were answered. The patient agreed with the plan and demonstrated an understanding of the instructions.   The patient was advised to call back or seek an in-person evaluation if the symptoms worsen or if the condition fails to improve as anticipated.  I provided 20 minutes of non-face-to-face interaction with this MYCHART visit including intake, same-day documentation, and chart review.   Clayborne Dana, NP

## 2021-02-15 NOTE — Progress Notes (Signed)
Attempted to contact patient at 115 pm, no answer. Left a detailed vm msg.

## 2021-03-14 ENCOUNTER — Encounter: Payer: Self-pay | Admitting: Physician Assistant

## 2021-03-14 MED ORDER — SCOPOLAMINE 1 MG/3DAYS TD PT72: 1.0000 | MEDICATED_PATCH | TRANSDERMAL | 0 refills | Status: DC

## 2021-05-14 ENCOUNTER — Encounter: Payer: Self-pay | Admitting: Physician Assistant

## 2021-05-20 ENCOUNTER — Other Ambulatory Visit: Payer: Self-pay

## 2021-05-20 ENCOUNTER — Ambulatory Visit (INDEPENDENT_AMBULATORY_CARE_PROVIDER_SITE_OTHER): Payer: Self-pay | Admitting: Physician Assistant

## 2021-05-20 ENCOUNTER — Encounter: Payer: Self-pay | Admitting: Physician Assistant

## 2021-05-20 ENCOUNTER — Other Ambulatory Visit: Payer: Self-pay | Admitting: Physician Assistant

## 2021-05-20 VITALS — BP 131/76 | HR 72 | Temp 98.3°F | Ht 64.25 in | Wt 212.0 lb

## 2021-05-20 DIAGNOSIS — N765 Ulceration of vagina: Secondary | ICD-10-CM

## 2021-05-20 DIAGNOSIS — F3341 Major depressive disorder, recurrent, in partial remission: Secondary | ICD-10-CM

## 2021-05-20 MED ORDER — VALACYCLOVIR HCL 1 G PO TABS: 1000.0000 mg | ORAL_TABLET | Freq: Two times a day (BID) | ORAL | 0 refills | Status: AC

## 2021-05-20 NOTE — Progress Notes (Signed)
   Subjective:    Patient ID: Michelle Berger, female    DOB: January 09, 1986, 35 y.o.   MRN: [id number]  HPI Pt is a 35 yo female who presents to the clinic with painful ulceration of left external labia. She has never had anything like this before. About 1 week ago she thought she felt a "bump" she pushed on it and clear/bloody liquid came out and then went into ulceration. No hx of herpes or other STD. She did test postive for MRSA at one point. She has put neosporin on ulcer with no help. No fever, chills, body aches. She is married with one partner. She is very worried about herpes.   .. 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   Abnormal weight gain 05/05/2017   Foreign body in auricle of right ear 05/05/2017   Motion sickness 11/18/2017   Plantar fasciitis, right 01/26/2018   Migraines 05/19/2016   History of recurrent pneumonia 11/05/2018   Anxiety 12/22/2018   Marijuana use, 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      Objective:   Physical Exam Genitourinary:    Vagina: No vaginal discharge.            Assessment & Plan:  Marland KitchenMarland KitchenCrystal was seen today for vaginal pain.  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 -     Herpes simplex virus culture -     Wound culture   Concerned for herpes. Discussed with patient and information given.  Labs ordered.  Full STD panel ordered.  Viral culture sent off.  Wound culture to look for any bacteria.  Valtrex started.  Cool compresses with ibuprofen and tylenol.    Spent 30 minutes with patient reviewing chart and discussing treatment plan and work up.

## 2021-05-20 NOTE — Patient Instructions (Signed)
[email or web address]">  Genital Herpes Genital herpes is a common sexually transmitted infection (STI) that is caused by a virus. The virus spreads from person to person through sexual contact. The infection can cause itching, blisters, and sores around the genitals or rectum. Symptoms may last for several days and then go away. This is called an outbreak. The virus remains in the body, however, so more outbreaks may happen in the future. The time between outbreaks varies and can be from months toyears. Genital herpes can affect anyone. It is particularly concerning for pregnant women because the virus can be passed to the baby during delivery and can cause serious problems. Genital herpes is also a concern for people who have a weak disease-fighting system (immune system). What are the causes? This condition is caused by the human herpesvirus, also called herpes simplex virus, type 1 or type 2 (HSV-1 or HSV-2). The virus may spread through: Sexual contact with an infected person, including vaginal, anal, and oral sex. Contact with fluid from a herpes sore. The skin. This means that you can get herpes from an infected partner even if there are no blisters or sores present. Your partner may not know that he or she is infected. What increases the risk? You are more likely to develop this condition if: You have sex with many partners. You do not use latex condoms during sex. What are the signs or symptoms? Most people do not have symptoms (are asymptomatic), or they have mild symptoms that may be mistaken for other skin problems. Symptoms may include: Small, red bumps near the genitals, rectum, or mouth. These bumps turn into blisters and then sores. Flu-like symptoms, including: Fever. Body aches. Swollen lymph nodes. Headache. Painful urination. Pain and itching in the genital area or rectal area. Vaginal discharge. Tingling  or shooting pain in the legs and buttocks. Generally, symptoms are more severe and last longer during the first (primary) outbreak. Flu-like symptoms are also more common during the primary outbreak. How is this diagnosed? This condition may be diagnosed based on: A physical exam. Your medical history. Blood tests. A test of a fluid sample (culture) from an open sore. How is this treated? There is no cure for this condition, but treatment with antiviral medicines that are taken by mouth (orally) can do the following: Speed up healing and relieve symptoms. Help to reduce the spread of the virus to sexual partners. Limit the chance of future outbreaks, or make future outbreaks shorter. Lessen symptoms of future outbreaks. Your health care provider may also recommend pain relief medicines, such asaspirin or ibuprofen. Follow these instructions at home: If you have an outbreak: Keep the affected areas dry and clean. Avoid rubbing or touching blisters and sores. If you do touch blisters or sores: Wash your hands thoroughly with soap and water. Do not touch your eyes afterward. To help relieve pain or itching, you may take the following actions as told by your health care provider: Apply a cold, wet cloth (cold compress) to affected areas 4-6 times a day. Apply a substance that protects your skin and reduces bleeding (astringent). Apply a gel that helps relieve pain around sores (lidocaine gel). Take a warm, shallow bath that cleans the genital area (sitz bath). Sexual activity Do not have sexual contact during active outbreaks. Practice safe sex. Herpes can spread even if your partner does not have blisters or sores. Latex condoms and female condoms may help prevent the spread of the herpes virus. General instructions Take over-the-counter and   prescription medicines only as told by your health care provider. Keep all follow-up visits as told by your health care provider. This is  important. How is this prevented? Use condoms. Although you can get genital herpes during sexual contact even with the use of a condom, a condom can provide some protection. Avoid having multiple sexual partners. Talk with your sexual partner about any symptoms either of you may have. Also, talk with your partner about any history of STIs. Get tested for STIs before you have sex. Ask your partner to do the same. Do not have sexual contact if you have active symptoms of genital herpes. Contact a health care provider if: Your symptoms are not improving with medicine. Your symptoms return, or you have new symptoms. You have a fever. You have abdominal pain. You have redness, swelling, or pain in your eye. You notice new sores on other parts of your body. You are a woman and you experience bleeding between menstrual periods. You have had herpes and you become pregnant or plan to become pregnant. Summary Genital herpes is a common sexually transmitted infection (STI) that is caused by the herpes simplex virus, type 1 or type 2 (HSV-1 or HSV-2). These viruses are most often spread through sexual contact with an infected person. You are more likely to develop this condition if you have sex with many partners or you do not use condoms during sex. Most people do not have symptoms (are asymptomatic) or have mild symptoms that may be mistaken for other skin problems. Symptoms occur as outbreaks that may happen months or years apart. There is no cure for this condition, but treatment with oral antiviral medicines can reduce symptoms, reduce the chance of spreading the virus to a partner, prevent future outbreaks, or shorten future outbreaks. This information is not intended to replace advice given to you by your health care provider. Make sure you discuss any questions you have with your healthcare provider. Document Revised: 06/16/2019 Document Reviewed: 06/16/2019 Elsevier Patient Education  2022  Elsevier Inc.  

## 2021-05-21 ENCOUNTER — Encounter: Payer: Self-pay | Admitting: Physician Assistant

## 2021-05-21 NOTE — Progress Notes (Signed)
No bacteria growing out of culture.

## 2021-05-21 NOTE — Progress Notes (Signed)
Immediate exposure herpes labs are still pending.   You do have antibodies in your blood to past exposure to herpes type I and II. You have more of a response to type 1(which is the oral/cold sore) herpes. We are still waiting for viral culture of lesion and IgM testing.

## 2021-05-21 NOTE — Telephone Encounter (Signed)
Pt has seen some of the test results through MyChart and is wanting more info regarding what has come back

## 2021-05-22 ENCOUNTER — Other Ambulatory Visit: Payer: Self-pay | Admitting: Physician Assistant

## 2021-05-22 DIAGNOSIS — F419 Anxiety disorder, unspecified: Secondary | ICD-10-CM

## 2021-05-22 LAB — HERPES SIMPLEX VIRUS CULTURE
MICRO NUMBER:: 12188154
SPECIMEN QUALITY:: ADEQUATE

## 2021-05-22 MED ORDER — DIAZEPAM 5 MG PO TABS: 5.0000 mg | ORAL_TABLET | Freq: Two times a day (BID) | ORAL | 1 refills | Status: DC | PRN

## 2021-05-22 NOTE — Progress Notes (Signed)
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 Valtrex should be helping. If you need some extra pain medication please let me know. Cool compresses. We can schedule a phone call to discuss all the in and outs again. As I know this is shocking. I want to wait until the IgM labs return as well.

## 2021-05-23 ENCOUNTER — Telehealth: Payer: Self-pay

## 2021-05-23 ENCOUNTER — Other Ambulatory Visit: Payer: Self-pay | Admitting: Physician Assistant

## 2021-05-23 DIAGNOSIS — A6004 Herpesviral vulvovaginitis: Secondary | ICD-10-CM

## 2021-05-23 DIAGNOSIS — F3341 Major depressive disorder, recurrent, in partial remission: Secondary | ICD-10-CM

## 2021-05-23 DIAGNOSIS — A6000 Herpesviral infection of urogenital system, unspecified: Secondary | ICD-10-CM | POA: Insufficient documentation

## 2021-05-23 LAB — WOUND CULTURE
MICRO NUMBER:: 12188412
RESULT:: NORMAL
SPECIMEN QUALITY:: ADEQUATE

## 2021-05-23 MED ORDER — LIDOCAINE 5 % EX OINT
TOPICAL_OINTMENT | CUTANEOUS | 0 refills | Status: DC
Start: 2021-05-23 — End: 2021-07-02

## 2021-05-23 MED ORDER — TRAMADOL HCL 50 MG PO TABS: 50.0000 mg | ORAL_TABLET | Freq: Four times a day (QID) | ORAL | 0 refills | Status: AC | PRN

## 2021-05-23 NOTE — Progress Notes (Signed)
Discussed with patient herpes results in blood and culture. The pain is significant. Sent tramadol and lidocaine numbing gel.

## 2021-05-23 NOTE — Progress Notes (Signed)
No bacteria growth on culture.

## 2021-05-23 NOTE — Telephone Encounter (Signed)
Pt was seen in office 2 days ago and states that her cough is much worse now. She is having to use her rescue inhaler twice a day. She is wanting to know if she should get an x-ray. Pt states she does have a hx of pneumonia. Pt states she did have COVID 2 weeks ago but did not have a cough with it much. Current cough is productive of yellow mucus.

## 2021-05-27 NOTE — Telephone Encounter (Signed)
I assume this was supposed to be routed to you.

## 2021-05-28 MED ORDER — SERTRALINE HCL 50 MG PO TABS: 50.0000 mg | ORAL_TABLET | Freq: Every day | ORAL | 1 refills | Status: DC

## 2021-05-28 MED ORDER — AZITHROMYCIN 250 MG PO TABS: ORAL_TABLET | ORAL | 0 refills | Status: DC

## 2021-05-28 NOTE — Telephone Encounter (Signed)
Sent zpak to start.

## 2021-05-28 NOTE — Telephone Encounter (Signed)
Called patient, she states Symbicort has helped cough some but still there and still coughing up yellow phlegm. No other new symptoms.

## 2021-05-28 NOTE — Telephone Encounter (Signed)
Tried to call patient to make her aware. No answer.

## 2021-05-28 NOTE — Telephone Encounter (Signed)
LMOM for patient letting her know medication sent to the pharmacy to call if she has questions.

## 2021-05-29 LAB — HSV 1/2 AB (IGM), IFA W/RFLX TITER
HSV 1 IgM Screen: NEGATIVE
HSV 2 IgM Screen: NEGATIVE

## 2021-05-29 LAB — HSV(HERPES SIMPLEX VRS) I + II AB-IGG
HAV 1 IGG,TYPE SPECIFIC AB: 34 index — ABNORMAL HIGH
HSV 2 IGG,TYPE SPECIFIC AB: 1.9 index — ABNORMAL HIGH

## 2021-05-30 NOTE — Progress Notes (Signed)
Both immediate exposure herpes blood test were negative.

## 2021-07-02 ENCOUNTER — Other Ambulatory Visit: Payer: Self-pay

## 2021-07-02 ENCOUNTER — Ambulatory Visit (INDEPENDENT_AMBULATORY_CARE_PROVIDER_SITE_OTHER): Payer: Self-pay | Admitting: Physician Assistant

## 2021-07-02 VITALS — BP 109/72 | HR 90 | Ht 64.25 in | Wt 208.0 lb

## 2021-07-02 DIAGNOSIS — A6004 Herpesviral vulvovaginitis: Secondary | ICD-10-CM

## 2021-07-02 DIAGNOSIS — Z0000 Encounter for general adult medical examination without abnormal findings: Secondary | ICD-10-CM

## 2021-07-02 DIAGNOSIS — J0140 Acute pansinusitis, unspecified: Secondary | ICD-10-CM

## 2021-07-02 DIAGNOSIS — Z1329 Encounter for screening for other suspected endocrine disorder: Secondary | ICD-10-CM

## 2021-07-02 DIAGNOSIS — E6609 Other obesity due to excess calories: Secondary | ICD-10-CM

## 2021-07-02 DIAGNOSIS — Z13220 Encounter for screening for lipoid disorders: Secondary | ICD-10-CM

## 2021-07-02 DIAGNOSIS — Z131 Encounter for screening for diabetes mellitus: Secondary | ICD-10-CM

## 2021-07-02 DIAGNOSIS — Z6838 Body mass index (BMI) 38.0-38.9, adult: Secondary | ICD-10-CM

## 2021-07-02 DIAGNOSIS — F3341 Major depressive disorder, recurrent, in partial remission: Secondary | ICD-10-CM

## 2021-07-02 MED ORDER — BUPROPION HCL ER (XL) 300 MG PO TB24: 300.0000 mg | ORAL_TABLET | Freq: Every day | ORAL | 3 refills | Status: DC

## 2021-07-02 MED ORDER — PLENITY PO CAPS
3.0000 | ORAL_CAPSULE | Freq: Two times a day (BID) | ORAL | 5 refills | Status: DC
Start: 2021-07-02 — End: 2021-11-08

## 2021-07-02 MED ORDER — FLUCONAZOLE 150 MG PO TABS: 150.0000 mg | ORAL_TABLET | Freq: Every day | ORAL | 0 refills | Status: DC

## 2021-07-02 MED ORDER — AMOXICILLIN-POT CLAVULANATE 875-125 MG PO TABS: 1.0000 | ORAL_TABLET | Freq: Two times a day (BID) | ORAL | 0 refills | Status: AC

## 2021-07-02 MED ORDER — VALACYCLOVIR HCL 500 MG PO TABS: 500.0000 mg | ORAL_TABLET | Freq: Two times a day (BID) | ORAL | 0 refills | Status: AC

## 2021-07-02 MED ORDER — SERTRALINE HCL 100 MG PO TABS: 100.0000 mg | ORAL_TABLET | Freq: Every day | ORAL | 3 refills | Status: DC

## 2021-07-02 NOTE — Progress Notes (Signed)
Subjective:    Patient ID: Michelle Berger, female    DOB: 1986/09/04, 35 y.o.   MRN: [id number]  HPI Patient is a 35 year old obese female who presents to the clinic for follow-up and medication refills.  Patient has not had fasting labs in a while.  Patient was recently diagnosed with genital herpes and is having a hard time with her mood since.  She just feels like she has gotten inside of her head.  She has no motivation to work out.  Her anxiety and depression have both increased.  She is sleeping as long she takes trazodone.  She denies any suicidal thoughts or homicidal idealizations.  She is just worried about having another herpes outbreak.  She has been on Zoloft 50 mg and Wellbutrin 300 mg for a while.  She wonders if this needs to be changed.  She is very frustrated with her weight.  She wonders what she can do about this.  She admits she is not exercising nor eating right.  Patient has had upper respiratory symptoms for the last 2 weeks.  She feels like it is moving into her chest.  She does have a history of childhood asthma and asthmatic bronchitis.  She denies any significant shortness of breath or wheezing.  She has started to cough.  She has a lot of sinus pressure.  She is blowing out green mucus.  She has tried over-the-counter Tylenol Cold sinus severe with no benefit.    .. 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   Abnormal weight gain 05/05/2017    Foreign body in auricle of right ear 05/05/2017   Motion sickness 11/18/2017   Plantar fasciitis, right 01/26/2018   Migraines 05/19/2016   History of recurrent pneumonia 11/05/2018   Anxiety 12/22/2018   Marijuana use, episodic 12/22/2018   Cough 12/22/2018   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     Objective:   Physical Exam Vitals reviewed.  Constitutional:      Appearance: Normal appearance. She is obese.  HENT:     Head: Normocephalic.     Right Ear: Tympanic membrane, ear canal and external ear normal. There is no impacted cerumen.     Left Ear: Tympanic membrane, ear canal and external ear normal. There is no impacted cerumen.     Nose: Congestion present.     Mouth/Throat:     Mouth: Mucous membranes are moist.     Pharynx: Posterior oropharyngeal erythema present.  Eyes:  General:        Right eye: No discharge.        Left eye: No discharge.     Extraocular Movements: Extraocular movements intact.     Conjunctiva/sclera: Conjunctivae normal.     Pupils: Pupils are equal, round, and reactive to light.  Neck:     Vascular: No carotid bruit.  Cardiovascular:     Rate and Rhythm: Normal rate and regular rhythm.     Pulses: Normal pulses.     Heart sounds: Normal heart sounds.  Pulmonary:     Effort: Pulmonary effort is normal.     Breath sounds: Normal breath sounds.  Abdominal:     Palpations: Abdomen is soft.  Lymphadenopathy:     Cervical: No cervical adenopathy.  Neurological:     General: No focal  deficit present.     Mental Status: She is alert and oriented to person, place, and time.  Psychiatric:        Mood and Affect: Mood normal.   .. Depression screen [facility] 2/9 07/02/2021 05/02/2020 08/29/2019 07/28/2018 11/18/2017  Decreased Interest 1 0 0 0 2  Down, Depressed, Hopeless 0 0 0 0 0  PHQ - 2 Score 1 0 0 0 2  Altered sleeping 2 0 - 1 0  Tired, decreased energy 2 1 - 2 3  Change in appetite 1 3 - 0 0  Feeling bad or failure about yourself  0 0 - 3 0  Trouble concentrating 2 3 - 0 3  Moving slowly or fidgety/restless 0 0 - 0 0  Suicidal thoughts 0 0 - 0 0  PHQ-9 Score 8 7 - 6 8  Difficult doing work/chores Somewhat difficult - - Somewhat difficult -   .. GAD 7 : Generalized Anxiety Score 07/02/2021 05/02/2020 08/29/2019 07/28/2018  Nervous, Anxious, on Edge 3 0 3 2  Control/stop worrying 1 1 3 2   Worry too much - different things 2 1 3 2   Trouble relaxing 1 0 3 2  Restless 0 0 3 0  Easily annoyed or irritable 2 0 3 2  Afraid - awful might happen 0 1 1 1   Total GAD 7 Score 9 3 19 11   Anxiety Difficulty Somewhat difficult Not difficult at all Very difficult -            Assessment & Plan:   Michelle Berger was seen today for sinus problem.  Diagnoses and all orders for this visit:  Routine physical examination -     TSH -     Lipid Panel w/reflex Direct LDL -     COMPLETE METABOLIC PANEL WITH GFR -     CBC with Differential/Platelet  Screening for diabetes mellitus -     COMPLETE METABOLIC PANEL WITH GFR  Lipid screening -     Lipid Panel w/reflex Direct LDL  Thyroid disorder screen -     TSH  Recurrent major depressive disorder, in partial remission (HCC) -     sertraline (ZOLOFT) 100 MG tablet; Take 1 tablet (100 mg total) by mouth daily. -     buPROPion (WELLBUTRIN XL) 300 MG 24 hr tablet; Take 1 tablet (300 mg total) by mouth daily.  Herpes simplex vulvovaginitis -     valACYclovir (VALTREX) 500 MG tablet; Take 1 tablet (500 mg total) by mouth 2 (two) times  daily for 3 days. As needed for outbreak  Class 2 obesity due to excess calories without serious comorbidity with body mass index (BMI) of 38.0 to 38.9  in adult -     Carboxymeth-Cellulose-CitricAc (PLENITY) CAPS; Take 3 capsules by mouth 2 (two) times daily before a meal. Take with 16oz of water before meals.  Acute non-recurrent pansinusitis -     amoxicillin-clavulanate (AUGMENTIN) 875-125 MG tablet; Take 1 tablet by mouth 2 (two) times daily for 10 days.  Other orders -     fluconazole (DIFLUCAN) 150 MG tablet; Take 1 tablet (150 mg total) by mouth daily. Repeat in 48-72 hours if symptoms persist.  .. Discussed 150 minutes of exercise a week.  Encouraged vitamin D 1000 units and Calcium 1300mg  or 4 servings of dairy a day.  PHQ/GAD not to goal after recent herpes dx. Increased zoloft to 100mg  daily.  Screening labs ordered.  Pap UTD. Discussed valtrex suppression for herpes. She agreed to prescription for outbreak as needed.  Declined flu shot.  Covid vaccine x2.   Treated sinusitis with augmentin. Diflucan for yeast after. Follow up as needed.   .Discussed low carb diet with 1500 calories and 80g of protein.  Exercising at least 150 minutes a week.  My Fitness Pal could be a .  On wellbutrrin.  Discussed topamax.  Avoid phentermine due to anxiety.  No insurance.  Consider plenity. Sent rx.

## 2021-07-02 NOTE — Progress Notes (Signed)
Discuss mood - feels like meds may need adjusted  Currently on zoloft 50 mg qd, wellbutrin 300 mg xr qd, valium PRN  Discuss weight loss  Had sinus issues last week, now in her chest

## 2021-07-05 ENCOUNTER — Encounter: Payer: Self-pay | Admitting: Physician Assistant

## 2021-09-17 ENCOUNTER — Encounter: Payer: Self-pay | Admitting: Physician Assistant

## 2021-09-17 ENCOUNTER — Other Ambulatory Visit: Payer: Self-pay

## 2021-09-17 ENCOUNTER — Ambulatory Visit (INDEPENDENT_AMBULATORY_CARE_PROVIDER_SITE_OTHER): Payer: Self-pay | Admitting: Physician Assistant

## 2021-09-17 VITALS — BP 122/72 | HR 95 | Ht 64.0 in | Wt 216.0 lb

## 2021-09-17 DIAGNOSIS — N3000 Acute cystitis without hematuria: Secondary | ICD-10-CM

## 2021-09-17 DIAGNOSIS — R102 Pelvic and perineal pain: Secondary | ICD-10-CM

## 2021-09-17 LAB — POCT URINALYSIS DIPSTICK
Bilirubin, UA: NEGATIVE
Blood, UA: NEGATIVE
Glucose, UA: NEGATIVE
Ketones, UA: NEGATIVE
Nitrite, UA: NEGATIVE
Protein, UA: POSITIVE — AB
Spec Grav, UA: 1.02 (ref 1.010–1.025)
Urobilinogen, UA: 0.2 E.U./dL
pH, UA: 7.5 (ref 5.0–8.0)

## 2021-09-17 MED ORDER — FLUCONAZOLE 150 MG PO TABS: 150.0000 mg | ORAL_TABLET | Freq: Every day | ORAL | 0 refills | Status: DC

## 2021-09-17 MED ORDER — NITROFURANTOIN MONOHYD MACRO 100 MG PO CAPS: 100.0000 mg | ORAL_CAPSULE | Freq: Two times a day (BID) | ORAL | 0 refills | Status: DC

## 2021-09-17 NOTE — Patient Instructions (Signed)

## 2021-09-17 NOTE — Progress Notes (Signed)
Subjective:    Patient ID: Michelle Berger, female    DOB: 08/01/1986, 35 y.o.   MRN: [id number]  HPI 35 y.o female presenting to the clinic after 5 days of suprapubic pressure and urinary urgency.  Reports no lifestyle changes to trigger a UTI. Pt endorses some fatigue but denies dysuria, hematuria, cloudy urine, vaginal discharge/odor, back pain, fever, chills, or diarrhea. Not tried anything to make better.   .. 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   Abnormal weight gain 05/05/2017   Foreign body in auricle of right ear 05/05/2017   Motion sickness 11/18/2017   Plantar fasciitis, right 01/26/2018   Migraines 05/19/2016   History of recurrent pneumonia 11/05/2018   Anxiety 12/22/2018   Marijuana use, episodic 12/22/2018   Cough 12/22/2018   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     Objective:   Physical Exam Vitals reviewed.  Constitutional:      Appearance: Normal appearance. She is obese.  HENT:      Head: Normocephalic.  Cardiovascular:     Rate and Rhythm: Normal rate and regular rhythm.  Pulmonary:     Effort: Pulmonary effort is normal.  Abdominal:     General: Bowel sounds are normal. There is no distension.     Palpations: Abdomen is soft. There is no mass.     Tenderness: There is abdominal tenderness. There is no right CVA tenderness, left CVA tenderness, guarding or rebound.     Hernia: No hernia is present.     Comments: Diffuse suprapubic pressure and tenderness.   Neurological:     General: No focal deficit present.     Mental Status: She is alert and oriented to person, place, and time.  Psychiatric:        Mood and Affect: Mood normal.   .. Results for orders placed or performed in visit on 09/17/21  POCT urinalysis dipstick  Result Value Ref Range   Color, UA Yellow    Clarity, UA Clear    Glucose, UA Negative Negative   Bilirubin, UA Negative    Ketones, UA Negative    Spec Grav, UA 1.020 1.010 - 1.025   Blood, UA Negative    pH, UA 7.5 5.0 - 8.0   Protein, UA Positive (A) Negative   Urobilinogen, UA 0.2 0.2 or 1.0 E.U./dL   Nitrite, UA Negative    Leukocytes, UA Moderate (2+) (A) Negative   Appearance     Odor  Assessment & Plan:  Diagnoses and all orders for this visit: Acute cystitis without hematuria -     Urine Culture -     POCT urinalysis dipstick -     fluconazole (DIFLUCAN) 150 MG tablet; Take 1 tablet (150 mg total) by mouth daily. Repeat in 48-72 hours if symptoms persist. -     nitrofurantoin, macrocrystal-monohydrate, (MACROBID) 100 MG capsule; Take 1 capsule (100 mg total) by mouth 2 (two) times daily. For 5 days. Suprapubic pressure   UA positive for protein and large leukocytes.  Will culture.  Pt to begin Macrobid for UTI Urine culture sent for further workup Diflucan ordered due to pt history of recurrent vaginal yeast infections after abx.  Pt to drink Cranberry juice for additional urinary tract support.   Follow up as needed or if symptoms worsen.

## 2021-09-20 LAB — URINE CULTURE
MICRO NUMBER:: 12692347
SPECIMEN QUALITY:: ADEQUATE

## 2021-09-20 NOTE — Progress Notes (Signed)
Sensitive to abx. Should be feeling better.

## 2021-09-20 NOTE — Progress Notes (Signed)
Confirmed E.coli in low colonies.

## 2021-10-31 ENCOUNTER — Other Ambulatory Visit: Payer: Self-pay | Admitting: Physician Assistant

## 2021-10-31 ENCOUNTER — Other Ambulatory Visit: Payer: Self-pay

## 2021-10-31 DIAGNOSIS — R059 Cough, unspecified: Secondary | ICD-10-CM

## 2021-10-31 DIAGNOSIS — J4541 Moderate persistent asthma with (acute) exacerbation: Secondary | ICD-10-CM

## 2021-10-31 DIAGNOSIS — J069 Acute upper respiratory infection, unspecified: Secondary | ICD-10-CM

## 2021-10-31 NOTE — Telephone Encounter (Signed)
Michelle Berger called and states she would like to restart the Symbicort. She has had to use her Albuterol inhaler multiple times a day.   Pended prescription.

## 2021-11-01 MED ORDER — BUDESONIDE-FORMOTEROL FUMARATE 160-4.5 MCG/ACT IN AERO: 2.0000 | INHALATION_SPRAY | Freq: Two times a day (BID) | RESPIRATORY_TRACT | 1 refills | Status: DC

## 2021-11-08 ENCOUNTER — Encounter: Payer: Self-pay | Admitting: Physician Assistant

## 2021-11-08 ENCOUNTER — Other Ambulatory Visit: Payer: Self-pay

## 2021-11-08 ENCOUNTER — Ambulatory Visit (INDEPENDENT_AMBULATORY_CARE_PROVIDER_SITE_OTHER): Payer: Self-pay | Admitting: Physician Assistant

## 2021-11-08 VITALS — BP 113/72 | HR 78 | Ht 64.0 in | Wt 215.0 lb

## 2021-11-08 DIAGNOSIS — J069 Acute upper respiratory infection, unspecified: Secondary | ICD-10-CM

## 2021-11-08 DIAGNOSIS — J4541 Moderate persistent asthma with (acute) exacerbation: Secondary | ICD-10-CM

## 2021-11-08 MED ORDER — PREDNISONE 20 MG PO TABS: ORAL_TABLET | ORAL | 0 refills | Status: DC

## 2021-11-08 MED ORDER — AZITHROMYCIN 250 MG PO TABS: ORAL_TABLET | ORAL | 0 refills | Status: DC

## 2021-11-08 MED ORDER — ALBUTEROL SULFATE HFA 108 (90 BASE) MCG/ACT IN AERS: 1.0000 | INHALATION_SPRAY | RESPIRATORY_TRACT | 1 refills | Status: DC | PRN

## 2021-11-08 NOTE — Progress Notes (Signed)
Subjective:     Patient ID: Michelle Berger, female   DOB: [birth year], 36 y.o.   MRN: [id number]  Shortness of Breath Associated symptoms include wheezing. Pertinent negatives include no abdominal pain, chest pain, fever, rhinorrhea or sore throat.  36 YO female with a past medical history of asthma presents to the clinic complaining of shortness of breath x2 weeks. She has coughing fits and noticed that it feels like there is stuff in her chest x2days. She denies any other sick symptoms including fever, fatigue, chills, sinus congestion and sore throat. She reports her symptoms are exacerbated after she exercises. Patient has been using a friends duonebs for the past 3 days which has helped her symptoms. She has been using her albuterol inhaler 4-5 times per day and it doesn't seem to be helping as much as it normally does. She has been taking singulair daily and restarted taking her symbicort regularly after 4-5 months being off of it.   Review of Systems  Constitutional:  Positive for fatigue. Negative for chills and fever.  HENT:  Negative for congestion, rhinorrhea and sore throat.   Respiratory:  Positive for cough, shortness of breath and wheezing.   Cardiovascular:  Positive for palpitations. Negative for chest pain.  Gastrointestinal:  Negative for abdominal pain.  Neurological:  Positive for dizziness and light-headedness.      Objective:   Physical Exam Constitutional:      General: She is not in acute distress.    Appearance: Normal appearance. She is not ill-appearing, toxic-appearing or diaphoretic.  HENT:     Head: Normocephalic and atraumatic.     Nose: Nose normal.     Mouth/Throat:     Mouth: Mucous membranes are moist.     Pharynx: No oropharyngeal exudate or posterior oropharyngeal erythema.  Eyes:     Conjunctiva/sclera: Conjunctivae normal.  Cardiovascular:     Rate and Rhythm: Normal rate and regular rhythm.     Heart sounds: Normal heart sounds.   Pulmonary:     Effort: Pulmonary effort is normal.     Breath sounds: Examination of the left-upper field reveals wheezing. Examination of the right-lower field reveals wheezing. Examination of the left-lower field reveals wheezing. Wheezing present.  Musculoskeletal:        General: Normal range of motion.  Skin:    General: Skin is warm and dry.  Neurological:     Mental Status: She is alert and oriented to person, place, and time.  Psychiatric:        Mood and Affect: Mood normal.        Behavior: Behavior normal.        Thought Content: Thought content normal.        Judgment: Judgment normal.       Assessment:    Marland KitchenMarland KitchenCrystal was seen today for shortness of breath.  Diagnoses and all orders for this visit:  Moderate persistent asthma with exacerbation -     predniSONE (DELTASONE) 20 MG tablet; Take 3 tablets for 3 days, take 2 tablets for 3 days, take 1 tablet for 3 days, take 1/2 tablet for 4 days. -     albuterol (VENTOLIN HFA) 108 (90 Base) MCG/ACT inhaler; Inhale 1-2 puffs into the lungs every 4 (four) hours as needed for wheezing or shortness of breath.  Upper respiratory tract infection, unspecified type -     azithromycin (ZITHROMAX Z-PAK) 250 MG tablet; Take 2 tablets (500 mg) on  Day 1,  followed by 1 tablet (250  mg) once daily on Days 2 through 5.       Plan:    Start prednisone for acute asthma flare. Continue using duonebs and albuterol prn. Continue symbicort daily and singulair daily. If symptoms do not improve after 2-3 days of prednisone, add azithromycin for possible walking pneumonia. Pt self pay and decided for forego chest xray. At this time, I am more convinced of an asthma exacerbation and would hold the azithromycin for 2-3 days to see how patient responds to prednisone.   Follow up if symptoms worsen with prednisone and/or azithromycin.

## 2021-12-05 ENCOUNTER — Encounter: Payer: Self-pay | Admitting: Physician Assistant

## 2021-12-09 MED ORDER — WEGOVY 1 MG/0.5ML ~~LOC~~ SOAJ: 1.0000 mg | SUBCUTANEOUS | 0 refills | Status: DC

## 2021-12-09 MED ORDER — WEGOVY 0.25 MG/0.5ML ~~LOC~~ SOAJ: 0.2500 mg | SUBCUTANEOUS | 0 refills | Status: DC

## 2021-12-09 MED ORDER — WEGOVY 0.5 MG/0.5ML ~~LOC~~ SOAJ: 0.5000 mg | SUBCUTANEOUS | 0 refills | Status: DC

## 2022-01-10 ENCOUNTER — Telehealth: Payer: Self-pay

## 2022-01-10 NOTE — Telephone Encounter (Signed)
Initiated Prior authorization IWP:[id number] 0.5MG /0.5ML auto-injectors ?Via: Covermymeds ?Case/Key:N/a ?Status: cancelled as of 01/10/22 ?Reason:pt is self pay and does not have active insurance this request has been cancelled due that infomation ?Notified Pt via: Mychart ?

## 2022-01-14 ENCOUNTER — Other Ambulatory Visit: Payer: Self-pay | Admitting: Physician Assistant

## 2022-01-14 DIAGNOSIS — T7840XS Allergy, unspecified, sequela: Secondary | ICD-10-CM

## 2022-01-14 DIAGNOSIS — R059 Cough, unspecified: Secondary | ICD-10-CM

## 2022-01-14 DIAGNOSIS — J4540 Moderate persistent asthma, uncomplicated: Secondary | ICD-10-CM

## 2022-01-28 ENCOUNTER — Other Ambulatory Visit: Payer: Self-pay | Admitting: Physician Assistant

## 2022-01-29 ENCOUNTER — Other Ambulatory Visit: Payer: Self-pay | Admitting: Neurology

## 2022-01-29 MED ORDER — WEGOVY 0.5 MG/0.5ML ~~LOC~~ SOAJ: 0.5000 mg | SUBCUTANEOUS | 0 refills | Status: DC

## 2022-01-29 NOTE — Progress Notes (Signed)
Received note from pharmacy that patient's Wegovy 0.5 mg pen malfunctioned, she has a replacement coupon from manufacturer but needs new RX so she can get his filled. RX sent.  ?

## 2022-03-10 ENCOUNTER — Other Ambulatory Visit: Payer: Self-pay | Admitting: Physician Assistant

## 2022-03-14 ENCOUNTER — Telehealth: Payer: Self-pay | Admitting: Neurology

## 2022-03-14 MED ORDER — WEGOVY 1 MG/0.5ML ~~LOC~~ SOAJ: 1.0000 mg | SUBCUTANEOUS | 0 refills | Status: DC

## 2022-03-14 NOTE — Telephone Encounter (Signed)
Patient called for next dosage of Wegovy. She has not followed up since starting medication. Just finished 1 mg dose pack. I offered to send another month of 1 mg til she can get into the office and she needs to schedule appt. Transferred to the front desk to schedule patient for a follow up.

## 2022-03-24 ENCOUNTER — Ambulatory Visit: Payer: BC Managed Care – PPO | Admitting: Physician Assistant

## 2022-03-24 ENCOUNTER — Encounter: Payer: Self-pay | Admitting: Physician Assistant

## 2022-03-24 VITALS — BP 105/62 | HR 83 | Ht 64.0 in | Wt 208.0 lb

## 2022-03-24 DIAGNOSIS — E6609 Other obesity due to excess calories: Secondary | ICD-10-CM | POA: Diagnosis not present

## 2022-03-24 DIAGNOSIS — Z6835 Body mass index (BMI) 35.0-35.9, adult: Secondary | ICD-10-CM

## 2022-03-24 MED ORDER — WEGOVY 1.7 MG/0.75ML ~~LOC~~ SOAJ: 1.7000 mg | SUBCUTANEOUS | 0 refills | Status: DC

## 2022-03-24 MED ORDER — WEGOVY 2.4 MG/0.75ML ~~LOC~~ SOAJ: 2.4000 mg | SUBCUTANEOUS | 1 refills | Status: DC

## 2022-03-24 NOTE — Progress Notes (Signed)
   Established Patient Office Visit  Subjective   Patient ID: Michelle Berger, female    DOB: Jan 18, 1986  Age: 36 y.o. MRN: [id number]  Chief Complaint  Patient presents with   Follow-up    HPI Pt is a 36 yo obese female who presents to the clinic to follow-up on her weight loss medication, Wegovy.  She is doing really well on the 1 mg dose.  She has been on this since January and lost 8 pounds.  She denies minimal side effects of nausea and constipation.  She is exercising with a personal trainer 3-4 times a week.  She tries to stay active.  She does notice this medication has taken away a lot of of her cravings for food.  She would like to increase dose and stay on medication.  Her asthma has been very well controlled.  She has stopped symbicort.  She is not using her rescue inhaler.  Review of Systems  All other systems reviewed and are negative.    Objective:     BP 105/62   Pulse 83   Ht 5\' 4"  (1.626 m)   Wt 208 lb (94.3 kg)   SpO2 99%   BMI 35.70 kg/m  BP Readings from Last 3 Encounters:  03/24/22 105/62  11/08/21 113/72  09/17/21 122/72   Wt Readings from Last 3 Encounters:  03/24/22 208 lb (94.3 kg)  11/08/21 215 lb (97.5 kg)  09/17/21 216 lb (98 kg)      Physical Exam Vitals reviewed.  Constitutional:      Appearance: Normal appearance. She is obese.  HENT:     Head: Normocephalic.  Cardiovascular:     Rate and Rhythm: Normal rate and regular rhythm.     Pulses: Normal pulses.     Heart sounds: Normal heart sounds.  Pulmonary:     Effort: Pulmonary effort is normal.     Breath sounds: Normal breath sounds.  Neurological:     General: No focal deficit present.     Mental Status: She is alert and oriented to person, place, and time.  Psychiatric:        Mood and Affect: Mood normal.        Assessment & Plan:  Marland KitchenMarland KitchenCrystal was seen today for follow-up.  Diagnoses and all orders for this visit:  Class 2 obesity due to excess calories  without serious comorbidity with body mass index (BMI) of 35.0 to 35.9 in adult -     WEGOVY 1.7 MG/0.75ML SOAJ; Inject 1.7 mg into the skin once a week. Use this dose for 1 month (4 shots) and then increase to next higher dose. -     WEGOVY 2.4 MG/0.75ML SOAJ; Inject 2.4 mg into the skin once a week.   Losing weight and tolerating medication Sent 1.7 and 2.4mg  dose Continue to make diet and exercise goals Follow up in 6 months Goal weight is 160   Return in about 6 months (around 09/23/2022).    Iran Planas, PA-C

## 2022-06-23 ENCOUNTER — Ambulatory Visit
Admission: RE | Admit: 2022-06-23 | Discharge: 2022-06-23 | Disposition: A | Discharge status: Home / Self Care | Payer: BC Managed Care – PPO | Source: Ambulatory Visit | Attending: Family Medicine | Admitting: Family Medicine

## 2022-06-23 VITALS — BP 127/87 | HR 82 | Temp 98.9°F | Resp 14 | Ht 64.0 in | Wt 193.0 lb

## 2022-06-23 DIAGNOSIS — R220 Localized swelling, mass and lump, head: Secondary | ICD-10-CM

## 2022-06-23 DIAGNOSIS — K047 Periapical abscess without sinus: Secondary | ICD-10-CM

## 2022-06-23 MED ORDER — AMOXICILLIN-POT CLAVULANATE 875-125 MG PO TABS: 1.0000 | ORAL_TABLET | Freq: Two times a day (BID) | ORAL | 0 refills | Status: AC

## 2022-06-23 MED ORDER — PREDNISONE 20 MG PO TABS: ORAL_TABLET | ORAL | 0 refills | Status: DC

## 2022-06-23 NOTE — ED Triage Notes (Addendum)
Dental pain x 4 days  Lip swelling x 2 days  Facial pain on left w/ numbness Benadryl 2 tabs at 1500 Hurts to swallow  OTC  tylenol &  ibuprofen  Pt  was on vacation,  flew back in today  No new foods Telehealth visit yesterday - meds sent to a closed pharmacy

## 2022-06-23 NOTE — Discharge Instructions (Addendum)
Advised patient to take medication as directed with food to completion.  Advised patient to take prednisone with first dose of Augmentin for the next 5 of 10 days.  Advised patient to follow-up with dentist ASAP tomorrow, Tuesday, 06/24/2022.

## 2022-06-23 NOTE — ED Provider Notes (Signed)
[facility]    CSN: [id number] Arrival date & time: 06/23/22  1248      History   Chief Complaint Chief Complaint  Patient presents with   Facial Swelling    HPI Michelle Berger is a 36 y.o. female.   HPI 36 year old female presents with dental pain for 4 days, with face and lip swelling for 2 days.  Patient reports telehealth visit yesterday was sent Medrol Dosepak to her pharmacy which is currently closed.  PMH significant for morbid obesity, history of migraine headaches, and moderate persistent asthma with exacerbation.  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   Anxiety 12/22/2018   Marijuana use, episodic 12/22/2018   Cough 12/22/2018   History of recurrent pneumonia 11/05/2018   Plantar fasciitis, right 01/26/2018   Motion sickness 11/18/2017   Recurrent candidiasis of vagina 05/05/2017   Abnormal weight gain 05/05/2017   Foreign body in auricle of right ear 05/05/2017   Sebaceous cyst 01/28/2017   Inattention 01/28/2017   No energy 01/28/2017   PMDD (premenstrual dysphoric disorder) 12/04/2016   Migraines 05/19/2016   History of migraine headaches 01/04/2015   Tendinitis, de Quervain's 01/04/2015   Major depression 07/12/2013   Class 2 obesity due to excess calories without serious comorbidity with body mass index (BMI) of 35.0 to 35.9 in adult 07/12/2013   Preventive measure 07/12/2013   Seasonal and perennial allergic rhinitis 07/12/2013    Past Surgical History:  Procedure Laterality Date   LIPOSUCTION MULTIPLE BODY  PARTS N/A    abdomen and back   tummy tuck      OB History   No obstetric history on file.      Home Medications    Prior to Admission medications   Medication Sig Start Date End Date Taking? Authorizing Provider  amoxicillin-clavulanate (AUGMENTIN) 875-125 MG tablet Take 1 tablet by mouth 2 (two) times daily for 10 days. 06/23/22 07/03/22 Yes Trevor Iha, FNP  predniSONE (DELTASONE) 20 MG tablet Take 3 tabs PO daily x 5 days. 06/23/22  Yes Trevor Iha, FNP  albuterol (VENTOLIN HFA) 108 (90 Base) MCG/ACT inhaler Inhale 1-2 puffs into the lungs every 4 (four) hours as needed for wheezing or shortness of breath. 11/08/21   Breeback, Jade L, PA-C  buPROPion (WELLBUTRIN XL) 300 MG 24 hr tablet Take 1 tablet (300 mg total) by mouth daily. 07/02/21   Breeback, Jade L, PA-C  diazepam (VALIUM) 5 MG tablet Take 1 tablet (5 mg total) by mouth every 12 (twelve) hours as needed for anxiety. 05/22/21   Breeback, Jade L, PA-C  montelukast (SINGULAIR) 10 MG tablet Take 1 tablet (10 mg total) by mouth at bedtime. 01/14/22   Breeback, Jade L, PA-C  sertraline (ZOLOFT) 100 MG tablet Take 1 tablet (100 mg total) by mouth daily. 07/02/21   Breeback, Jade L, PA-C  WEGOVY 1 MG/0.5ML SOAJ Inject 1 mg into the skin once a week. Use this dose for 1 month (4 shots) and then increase to next higher dose. 03/14/22   Jomarie Longs, PA-C  WEGOVY 1.7 MG/0.75ML SOAJ Inject 1.7 mg into the skin once a week. Use this dose for 1 month (4 shots) and then increase to next higher dose. 03/24/22   Breeback, Jade L, PA-C  WEGOVY 2.4 MG/0.75ML SOAJ Inject 2.4 mg into the skin once a week. 03/24/22   Jomarie Longs, PA-C    Family History Family History  Adopted: Yes  Problem Relation Age of Onset   Allergic rhinitis Neg Hx    Angioedema Neg Hx    Asthma Neg Hx    Atopy Neg Hx    Eczema Neg Hx    Immunodeficiency Neg Hx    Urticaria Neg Hx     Social History Social History   Tobacco Use   Smoking status: Never    Smokeless tobacco: Never  Vaping Use   Vaping Use: Never used  Substance Use Topics   Alcohol use: Yes    Alcohol/week: 6.0 standard drinks of alcohol    Types: 6 Glasses of wine per week   Drug use: Yes    Types: Marijuana    Comment: Jule or vape pen     Allergies   Sulfa antibiotics   Review of Systems Review of Systems  HENT:  Positive for dental problem.   All other systems reviewed and are negative.    Physical Exam Triage Vital Signs ED Triage Vitals  Enc Vitals Group     BP 06/23/22 1302 127/87     Pulse Rate 06/23/22 1302 82     Resp 06/23/22 1302 14     Temp 06/23/22 1302 98.9 F (37.2 C)     Temp Source 06/23/22 1302 Oral     SpO2 06/23/22 1302 99 %     Weight 06/23/22 1304 193 lb (87.5 kg)     Height 06/23/22 1304 5\' 4"  (1.626 m)     Head Circumference --      Peak Flow --      Pain Score --      Pain Loc --      Pain Edu? --      Excl. in GC? --    No data found.  Updated Vital Signs BP 127/87 (BP Location: Left Arm)   Pulse 82   Temp 98.9 F (37.2 C) (Oral)   Resp 14   Ht 5\' 4"  (1.626 m)   Wt 193 lb (87.5 kg)   LMP 06/22/2022 (Exact Date)   SpO2 99%   BMI 33.13 kg/m    Physical Exam Vitals and nursing note reviewed.  Constitutional:      Appearance: Normal appearance. She is obese.  HENT:     Head: Normocephalic and atraumatic.     Mouth/Throat:     Mouth: Mucous membranes are moist.     Pharynx: Oropharynx is clear.     Comments: Left lower (third molar): Erythematous medial/gingival border noted Eyes:     Extraocular Movements: Extraocular movements intact.     Conjunctiva/sclera: Conjunctivae normal.     Pupils: Pupils are equal, round, and reactive to light.  Cardiovascular:     Rate and Rhythm: Normal rate and regular rhythm.     Pulses: Normal pulses.     Heart sounds: Normal heart sounds.  Pulmonary:     Effort: Pulmonary effort is normal.     Breath sounds: Normal breath sounds. No wheezing, rhonchi or rales.   Musculoskeletal:        General: Normal range of motion.     Cervical back: Normal  range of motion and neck supple.  Skin:    General: Skin is warm and dry.  Neurological:     General: No focal deficit present.     Mental Status: She is alert and oriented to person, place, and time.      UC Treatments / Results  Labs (all labs ordered are listed, but only abnormal results are displayed) Labs Reviewed - No data to display  EKG   Radiology No results found.  Procedures Procedures (including critical care time)  Medications Ordered in UC Medications - No data to display  Initial Impression / Assessment and Plan / UC Course  I have reviewed the triage vital signs and the nursing notes.  Pertinent labs & imaging results that were available during my care of the patient were reviewed by me and considered in my medical decision making (see chart for details).     MDM: 1.  Dental infection-Rx'd Augmentin. Advised patient to take medication as directed with food to completion.  Advised patient to take prednisone with first dose of; 2.  Left facial swelling-Rx'd prednisone for the next 5 of 10 days.  Advised patient to follow-up with dentist ASAP tomorrow, Tuesday, 06/24/2022.  Patient discharged home, hemodynamically stable. Final Clinical Impressions(s) / UC Diagnoses   Final diagnoses:  Dental infection  Left facial swelling     Discharge Instructions      Advised patient to take medication as directed with food to completion.  Advised patient to take prednisone with first dose of Augmentin for the next 5 of 10 days.  Advised patient to follow-up with dentist ASAP tomorrow, Tuesday, 06/24/2022.     ED Prescriptions     Medication Sig Dispense Auth. Provider   amoxicillin-clavulanate (AUGMENTIN) 875-125 MG tablet Take 1 tablet by mouth 2 (two) times daily for 10 days. 20 tablet Trevor Iha, FNP   predniSONE (DELTASONE) 20 MG tablet Take 3 tabs PO daily x 5 days. 15  tablet Trevor Iha, FNP      PDMP not reviewed this encounter.   Trevor Iha, FNP 06/23/22 1341

## 2022-06-24 ENCOUNTER — Telehealth: Payer: Self-pay

## 2022-06-24 NOTE — Telephone Encounter (Signed)
TCT pt to follow up from recent visit. Pt states she is currently at the dentist and denies any additional needs.

## 2022-07-05 ENCOUNTER — Other Ambulatory Visit: Payer: Self-pay | Admitting: Physician Assistant

## 2022-07-05 DIAGNOSIS — F3341 Major depressive disorder, recurrent, in partial remission: Secondary | ICD-10-CM

## 2022-07-20 ENCOUNTER — Other Ambulatory Visit: Payer: Self-pay | Admitting: Physician Assistant

## 2022-07-20 DIAGNOSIS — F3341 Major depressive disorder, recurrent, in partial remission: Secondary | ICD-10-CM

## 2022-07-30 DIAGNOSIS — F4323 Adjustment disorder with mixed anxiety and depressed mood: Secondary | ICD-10-CM | POA: Diagnosis not present

## 2022-08-06 DIAGNOSIS — F4323 Adjustment disorder with mixed anxiety and depressed mood: Secondary | ICD-10-CM | POA: Diagnosis not present

## 2022-08-13 DIAGNOSIS — F4323 Adjustment disorder with mixed anxiety and depressed mood: Secondary | ICD-10-CM | POA: Diagnosis not present

## 2022-08-14 ENCOUNTER — Telehealth: Payer: Self-pay

## 2022-08-14 ENCOUNTER — Telehealth: Payer: BC Managed Care – PPO | Admitting: Physician Assistant

## 2022-08-14 DIAGNOSIS — J019 Acute sinusitis, unspecified: Secondary | ICD-10-CM | POA: Diagnosis not present

## 2022-08-14 DIAGNOSIS — B379 Candidiasis, unspecified: Secondary | ICD-10-CM

## 2022-08-14 DIAGNOSIS — B9689 Other specified bacterial agents as the cause of diseases classified elsewhere: Secondary | ICD-10-CM | POA: Diagnosis not present

## 2022-08-14 DIAGNOSIS — T3695XA Adverse effect of unspecified systemic antibiotic, initial encounter: Secondary | ICD-10-CM | POA: Diagnosis not present

## 2022-08-14 MED ORDER — AMOXICILLIN-POT CLAVULANATE 875-125 MG PO TABS: 1.0000 | ORAL_TABLET | Freq: Two times a day (BID) | ORAL | 0 refills | Status: DC

## 2022-08-14 MED ORDER — FLUCONAZOLE 150 MG PO TABS: 150.0000 mg | ORAL_TABLET | ORAL | 0 refills | Status: DC | PRN

## 2022-08-14 NOTE — Progress Notes (Signed)
Virtual Visit Consent   Michelle Berger, you are scheduled for a virtual visit with a [facility] provider today. Just as with appointments in the office, your consent must be obtained to participate. Your consent will be active for this visit and any virtual visit you may have with one of our providers in the next 365 days. If you have a MyChart account, a copy of this consent can be sent to you electronically.  As this is a virtual visit, video technology does not allow for your provider to perform a traditional examination. This may limit your provider's ability to fully assess your condition. If your provider identifies any concerns that need to be evaluated in person or the need to arrange testing (such as labs, EKG, etc.), we will make arrangements to do so. Although advances in technology are sophisticated, we cannot ensure that it will always work on either your end or our end. If the connection with a video visit is poor, the visit may have to be switched to a telephone visit. With either a video or telephone visit, we are not always able to ensure that we have a secure connection.  By engaging in this virtual visit, you consent to the provision of healthcare and authorize for your insurance to be billed (if applicable) for the services provided during this visit. Depending on your insurance coverage, you may receive a charge related to this service.  I need to obtain your verbal consent now. Are you willing to proceed with your visit today? [name] has provided verbal consent on 08/14/2022 for a virtual visit (video or telephone). Margaretann Loveless, PA-C  Date: 08/14/2022 1:55 PM  Virtual Visit via Video Note   I, Margaretann Loveless, connected with  [name]  ([id number], Feb 26, 1986) on 08/14/22 at  1:45 PM EDT by a video-enabled telemedicine application and verified that I am speaking with the correct person using two  identifiers.  Location: Patient: Virtual Visit Location Patient: Mobile Provider: Virtual Visit Location Provider: Home Office   I discussed the limitations of evaluation and management by telemedicine and the availability of in person appointments. The patient expressed understanding and agreed to proceed.    History of Present Illness: Michelle Berger is a 36 y.o. who identifies as a female who was assigned female at birth, and is being seen today for possible sinus infection and chest congestion.  HPI: Sinusitis This is a new problem. The current episode started in the past 7 days. The problem has been gradually worsening since onset. There has been no fever. Associated symptoms include congestion, coughing, ear pain (bilateral), headaches, a hoarse voice, sinus pressure and a sore throat. Pertinent negatives include no chills or diaphoresis. (Rhinorrhea and post nasal drainage that is discolored green; has asthma and has had to use inhaler last two nights did have wheezing) Past treatments include oral decongestants (albuterol inhaler, sudafed). The treatment provided no relief.    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   Anxiety 12/22/2018   Marijuana use, episodic 12/22/2018   Cough 12/22/2018   History of recurrent pneumonia 11/05/2018   Plantar fasciitis, right 01/26/2018   Motion sickness 11/18/2017   Recurrent candidiasis  of vagina 05/05/2017   Abnormal weight gain 05/05/2017   Foreign body in auricle of right ear 05/05/2017   Sebaceous cyst 01/28/2017   Inattention 01/28/2017   No energy 01/28/2017   PMDD (premenstrual dysphoric disorder) 12/04/2016    Migraines 05/19/2016   History of migraine headaches 01/04/2015   Tendinitis, de Quervain's 01/04/2015   Major depression 07/12/2013   Class 2 obesity due to excess calories without serious comorbidity with body mass index (BMI) of 35.0 to 35.9 in adult 07/12/2013   Preventive measure 07/12/2013   Seasonal and perennial allergic rhinitis 07/12/2013    Allergies:  Allergies  Allergen Reactions   Sulfa Antibiotics Swelling   Medications:  Current Outpatient Medications:    amoxicillin-clavulanate (AUGMENTIN) 875-125 MG tablet, Take 1 tablet by mouth 2 (two) times daily., Disp: 20 tablet, Rfl: 0   fluconazole (DIFLUCAN) 150 MG tablet, Take 1 tablet (150 mg total) by mouth every 3 (three) days as needed., Disp: 2 tablet, Rfl: 0   albuterol (VENTOLIN HFA) 108 (90 Base) MCG/ACT inhaler, Inhale 1-2 puffs into the lungs every 4 (four) hours as needed for wheezing or shortness of breath., Disp: 18 g, Rfl: 1   buPROPion (WELLBUTRIN XL) 300 MG 24 hr tablet, Take 1 tablet (300 mg total) by mouth daily., Disp: 90 tablet, Rfl: 0   diazepam (VALIUM) 5 MG tablet, Take 1 tablet (5 mg total) by mouth every 12 (twelve) hours as needed for anxiety., Disp: 30 tablet, Rfl: 1   montelukast (SINGULAIR) 10 MG tablet, Take 1 tablet (10 mg total) by mouth at bedtime., Disp: 90 tablet, Rfl: 3   predniSONE (DELTASONE) 20 MG tablet, Take 3 tabs PO daily x 5 days., Disp: 15 tablet, Rfl: 0   sertraline (ZOLOFT) 100 MG tablet, Take 1 tablet (100 mg total) by mouth daily., Disp: 90 tablet, Rfl: 0   WEGOVY 1 MG/0.5ML SOAJ, Inject 1 mg into the skin once a week. Use this dose for 1 month (4 shots) and then increase to next higher dose., Disp: 2 mL, Rfl: 0   WEGOVY 1.7 MG/0.75ML SOAJ, Inject 1.7 mg into the skin once a week. Use this dose for 1 month (4 shots) and then increase to next higher dose., Disp: 3 mL, Rfl: 0   WEGOVY 2.4 MG/0.75ML SOAJ, Inject 2.4 mg into the skin once a week., Disp: 9 mL, Rfl:  1  Observations/Objective: Patient is well-developed, well-nourished in no acute distress.  Resting comfortably Head is normocephalic, atraumatic.  No labored breathing.  Speech is clear and coherent with logical content.  Patient is alert and oriented at baseline.    Assessment and Plan: 1. Acute bacterial sinusitis - amoxicillin-clavulanate (AUGMENTIN) 875-125 MG tablet; Take 1 tablet by mouth 2 (two) times daily.  Dispense: 20 tablet; Refill: 0  2. Antibiotic-induced yeast infection - fluconazole (DIFLUCAN) 150 MG tablet; Take 1 tablet (150 mg total) by mouth every 3 (three) days as needed.  Dispense: 2 tablet; Refill: 0  - Worsening symptoms that have not responded to OTC medications.  - Will give Augmentin - Continue allergy medications.  - Steam and humidifier can help - Stay well hydrated and get plenty of rest.  - Diflucan given as prophylaxis as patient tends to get vaginal yeast infections with antibiotic use - Seek in person evaluation if no symptom improvement or if symptoms worsen   Follow Up Instructions: I discussed the assessment and treatment plan with the patient. The patient was provided an opportunity to ask questions and all  were answered. The patient agreed with the plan and demonstrated an understanding of the instructions.  A copy of instructions were sent to the patient via MyChart unless otherwise noted below.    The patient was advised to call back or seek an in-person evaluation if the symptoms worsen or if the condition fails to improve as anticipated.  Time:  I spent 11 minutes with the patient via telehealth technology discussing the above problems/concerns.    Margaretann Loveless, PA-C

## 2022-08-14 NOTE — Patient Instructions (Signed)
[place], thank you for joining Mar Daring, PA-C for today's virtual visit.  While this provider is not your primary care provider (PCP), if your PCP is located in our provider database this encounter information will be shared with them immediately following your visit.   [place] account gives you access to today's visit and all your visits, tests, and labs performed at [facility] " click here if you don't have a [facility] account or go to [email or web address]  Consent: (Patient) Camella Rennis Harding provided verbal consent for this virtual visit at the beginning of the encounter.  Current Medications:  Current Outpatient Medications:    amoxicillin-clavulanate (AUGMENTIN) 875-125 MG tablet, Take 1 tablet by mouth 2 (two) times daily., Disp: 20 tablet, Rfl: 0   fluconazole (DIFLUCAN) 150 MG tablet, Take 1 tablet (150 mg total) by mouth every 3 (three) days as needed., Disp: 2 tablet, Rfl: 0   albuterol (VENTOLIN HFA) 108 (90 Base) MCG/ACT inhaler, Inhale 1-2 puffs into the lungs every 4 (four) hours as needed for wheezing or shortness of breath., Disp: 18 g, Rfl: 1   buPROPion (WELLBUTRIN XL) 300 MG 24 hr tablet, Take 1 tablet (300 mg total) by mouth daily., Disp: 90 tablet, Rfl: 0   diazepam (VALIUM) 5 MG tablet, Take 1 tablet (5 mg total) by mouth every 12 (twelve) hours as needed for anxiety., Disp: 30 tablet, Rfl: 1   montelukast (SINGULAIR) 10 MG tablet, Take 1 tablet (10 mg total) by mouth at bedtime., Disp: 90 tablet, Rfl: 3   predniSONE (DELTASONE) 20 MG tablet, Take 3 tabs PO daily x 5 days., Disp: 15 tablet, Rfl: 0   sertraline (ZOLOFT) 100 MG tablet, Take 1 tablet (100 mg total) by mouth daily., Disp: 90 tablet, Rfl: 0   WEGOVY 1 MG/0.5ML SOAJ, Inject 1 mg into the skin once a week. Use this dose for 1 month (4 shots) and then increase to next higher dose., Disp: 2 mL, Rfl: 0   WEGOVY 1.7 MG/0.75ML SOAJ, Inject 1.7 mg  into the skin once a week. Use this dose for 1 month (4 shots) and then increase to next higher dose., Disp: 3 mL, Rfl: 0   WEGOVY 2.4 MG/0.75ML SOAJ, Inject 2.4 mg into the skin once a week., Disp: 9 mL, Rfl: 1   Medications ordered in this encounter:  Meds ordered this encounter  Medications   amoxicillin-clavulanate (AUGMENTIN) 875-125 MG tablet    Sig: Take 1 tablet by mouth 2 (two) times daily.    Dispense:  20 tablet    Refill:  0    Order Specific Question:   Supervising Provider    Answer:   Chase Picket [[id number]]   fluconazole (DIFLUCAN) 150 MG tablet    Sig: Take 1 tablet (150 mg total) by mouth every 3 (three) days as needed.    Dispense:  2 tablet    Refill:  0    Order Specific Question:   Supervising Provider    Answer:   Chase Picket [id number]     *If you need refills on other medications prior to your next appointment, please contact your pharmacy*  Follow-Up: Call back or seek an in-person evaluation if the symptoms worsen or if the condition fails to improve as anticipated.  [place] [phone]  Other Instructions Sinus Infection, Adult A sinus infection, also called sinusitis, is inflammation of your sinuses. Sinuses are hollow spaces in the bones around your  face. Your sinuses are located: Around your eyes. In the middle of your forehead. Behind your nose. In your cheekbones. Mucus normally drains out of your sinuses. When your nasal tissues become inflamed or swollen, mucus can become trapped or blocked. This allows bacteria, viruses, and fungi to grow, which leads to infection. Most infections of the sinuses are caused by a virus. A sinus infection can develop quickly. It can last for up to 4 weeks (acute) or for more than 12 weeks (chronic). A sinus infection often develops after a cold. What are the causes? This condition is caused by anything that creates swelling in the sinuses or stops mucus from draining. This  includes: Allergies. Asthma. Infection from bacteria or viruses. Deformities or blockages in your nose or sinuses. Abnormal growths in the nose (nasal polyps). Pollutants, such as chemicals or irritants in the air. Infection from fungi. This is rare. What increases the risk? You are more likely to develop this condition if you: Have a weak body defense system (immune system). Do a lot of swimming or diving. Overuse nasal sprays. Smoke. What are the signs or symptoms? The main symptoms of this condition are pain and a feeling of pressure around the affected sinuses. Other symptoms include: Stuffy nose or congestion that makes it difficult to breathe through your nose. Thick yellow or greenish drainage from your nose. Tenderness, swelling, and warmth over the affected sinuses. A cough that may get worse at night. Decreased sense of smell and taste. Extra mucus that collects in the throat or the back of the nose (postnasal drip) causing a sore throat or bad breath. Tiredness (fatigue). Fever. How is this diagnosed? This condition is diagnosed based on: Your symptoms. Your medical history. A physical exam. Tests to find out if your condition is acute or chronic. This may include: Checking your nose for nasal polyps. Viewing your sinuses using a device that has a light (endoscope). Testing for allergies or bacteria. Imaging tests, such as an MRI or CT scan. In rare cases, a bone biopsy may be done to rule out more serious types of fungal sinus disease. How is this treated? Treatment for a sinus infection depends on the cause and whether your condition is chronic or acute. If caused by a virus, your symptoms should go away on their own within 10 days. You may be given medicines to relieve symptoms. They include: Medicines that shrink swollen nasal passages (decongestants). A spray that eases inflammation of the nostrils (topical intranasal corticosteroids). Rinses that help get rid  of thick mucus in your nose (nasal saline washes). Medicines that treat allergies (antihistamines). Over-the-counter pain relievers. If caused by bacteria, your health care provider may recommend waiting to see if your symptoms improve. Most bacterial infections will get better without antibiotic medicine. You may be given antibiotics if you have: A severe infection. A weak immune system. If caused by narrow nasal passages or nasal polyps, surgery may be needed. Follow these instructions at home: Medicines Take, use, or apply over-the-counter and prescription medicines only as told by your health care provider. These may include nasal sprays. If you were prescribed an antibiotic medicine, take it as told by your health care provider. Do not stop taking the antibiotic even if you start to feel better. Hydrate and humidify  Drink enough fluid to keep your urine pale yellow. Staying hydrated will help to thin your mucus. Use a cool mist humidifier to keep the humidity level in your home above 50%. Inhale steam for  10-15 minutes, 3-4 times a day, or as told by your health care provider. You can do this in the bathroom while a hot shower is running. Limit your exposure to cool or dry air. Rest Rest as much as possible. Sleep with your head raised (elevated). Make sure you get enough sleep each night. General instructions  Apply a warm, moist washcloth to your face 3-4 times a day or as told by your health care provider. This will help with discomfort. Use nasal saline washes as often as told by your health care provider. Wash your hands often with soap and water to reduce your exposure to germs. If soap and water are not available, use hand sanitizer. Do not smoke. Avoid being around people who are smoking (secondhand smoke). Keep all follow-up visits. This is important. Contact a health care provider if: You have a fever. Your symptoms get worse. Your symptoms do not improve within 10  days. Get help right away if: You have a severe headache. You have persistent vomiting. You have severe pain or swelling around your face or eyes. You have vision problems. You develop confusion. Your neck is stiff. You have trouble breathing. These symptoms may be an emergency. Get help right away. Call 911. Do not wait to see if the symptoms will go away. Do not drive yourself to the hospital. Summary A sinus infection is soreness and inflammation of your sinuses. Sinuses are hollow spaces in the bones around your face. This condition is caused by nasal tissues that become inflamed or swollen. The swelling traps or blocks the flow of mucus. This allows bacteria, viruses, and fungi to grow, which leads to infection. If you were prescribed an antibiotic medicine, take it as told by your health care provider. Do not stop taking the antibiotic even if you start to feel better. Keep all follow-up visits. This is important. This information is not intended to replace advice given to you by your health care provider. Make sure you discuss any questions you have with your health care provider. Document Revised: 09/10/2021 Document Reviewed: 09/10/2021 Elsevier Patient Education  2023 Elsevier Inc.    If you have been instructed to have an in-person evaluation today at a local Urgent Care facility, please use the link below. It will take you to a list of all of our available [facility], including address, phone number and hours of operation. Please do not delay care.  [facility]  If you or a family member do not have a primary care provider, use the link below to schedule a visit and establish care. When you choose a [facility] primary care physician or advanced practice provider, you gain a long-term partner in health. Find a Primary Care Provider  Learn more about [facility]'s in-office and virtual care options: [facility] - Get Care Now

## 2022-08-14 NOTE — Telephone Encounter (Signed)
Initiated Prior authorization JJK:KXFGHW 2.4MG /0.75ML auto-injectors Via: Covermymeds Case/Key:[id number] Status: Pending as of 08/14/22 Reason: Notified Pt via: Mychart

## 2022-08-26 DIAGNOSIS — F4323 Adjustment disorder with mixed anxiety and depressed mood: Secondary | ICD-10-CM | POA: Diagnosis not present

## 2022-09-03 DIAGNOSIS — F4323 Adjustment disorder with mixed anxiety and depressed mood: Secondary | ICD-10-CM | POA: Diagnosis not present

## 2022-09-23 ENCOUNTER — Ambulatory Visit
Admission: RE | Admit: 2022-09-23 | Discharge: 2022-09-23 | Disposition: A | Discharge status: Home / Self Care | Payer: BC Managed Care – PPO | Source: Ambulatory Visit | Attending: Family Medicine | Admitting: Family Medicine

## 2022-09-23 VITALS — BP 108/76 | HR 86 | Temp 98.5°F | Resp 16 | Ht 64.0 in | Wt 190.0 lb

## 2022-09-23 DIAGNOSIS — J069 Acute upper respiratory infection, unspecified: Secondary | ICD-10-CM

## 2022-09-23 DIAGNOSIS — J4541 Moderate persistent asthma with (acute) exacerbation: Secondary | ICD-10-CM | POA: Diagnosis not present

## 2022-09-23 MED ORDER — ALBUTEROL SULFATE (2.5 MG/3ML) 0.083% IN NEBU: 2.5000 mg | INHALATION_SOLUTION | Freq: Four times a day (QID) | RESPIRATORY_TRACT | 0 refills | Status: DC | PRN

## 2022-09-23 MED ORDER — METHYLPREDNISOLONE 4 MG PO TBPK: ORAL_TABLET | ORAL | 0 refills | Status: DC

## 2022-09-23 MED ORDER — BENZONATATE 200 MG PO CAPS: 200.0000 mg | ORAL_CAPSULE | Freq: Two times a day (BID) | ORAL | 0 refills | Status: DC | PRN

## 2022-09-23 MED ORDER — AZITHROMYCIN 250 MG PO TABS: ORAL_TABLET | ORAL | 0 refills | Status: DC

## 2022-09-23 NOTE — Discharge Instructions (Signed)
Continue with your usual asthma medications and inhalers I have refilled your albuterol solution Take the prednisone as directed I have prescribed Tessalon to take as needed for cough You have a written prescription for azithromycin.  Fill and take this if you fail to improve with conservative treatment Follow-up with Lesly Rubenstein next week if needed

## 2022-09-23 NOTE — ED Provider Notes (Signed)
[facility]    CSN: [id number] Arrival date & time: 09/23/22  0815      History   Chief Complaint Chief Complaint  Patient presents with   Cough    HPI Michelle Berger is a 36 y.o. female.   HPI  Patient has chronic asthma and frequent respiratory infections.  States she has had pneumonia 4 times.  Currently has a cough since Friday.  On Sunday her temperature was up to 102.3.  She has been sick for 5 days now.  Still has a cough, chest heaviness, chest tightness.  Has run out of her albuterol and needs a refill.  States that her chest is feeling heavy.  She has concerns for pneumonia.  She states she is feeling very tired.  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   Anxiety 12/22/2018   Marijuana use, episodic 12/22/2018   Cough 12/22/2018   History of recurrent pneumonia 11/05/2018   Plantar fasciitis, right 01/26/2018   Motion sickness 11/18/2017   Recurrent candidiasis of vagina 05/05/2017   Abnormal weight gain 05/05/2017   Foreign body in auricle of right ear 05/05/2017   Sebaceous cyst 01/28/2017   Inattention 01/28/2017   No energy 01/28/2017   PMDD (premenstrual dysphoric disorder) 12/04/2016   Migraines 05/19/2016   History of migraine headaches 01/04/2015   Tendinitis, de Quervain's 01/04/2015   Major depression 07/12/2013   Class 2 obesity due to excess calories without serious comorbidity with body mass index  (BMI) of 35.0 to 35.9 in adult 07/12/2013   Preventive measure 07/12/2013   Seasonal and perennial allergic rhinitis 07/12/2013    Past Surgical History:  Procedure Laterality Date   LIPOSUCTION MULTIPLE BODY PARTS N/A    abdomen and back   tummy tuck      OB History   No obstetric history on file.      Home Medications    Prior to Admission medications   Medication Sig Start Date End Date Taking? Authorizing Provider  albuterol (PROVENTIL) (2.5 MG/3ML) 0.083% nebulizer solution Take 3 mLs (2.5 mg total) by nebulization every 6 (six) hours as needed for wheezing or shortness of breath. 09/23/22  Yes Eustace Moore, MD  azithromycin (ZITHROMAX Z-PAK) 250 MG tablet Take two pills today followed by one a day until gone 09/23/22  Yes Eustace Moore, MD  benzonatate (TESSALON) 200 MG capsule Take 1 capsule (200 mg total) by mouth 2 (two) times daily as needed for cough. 09/23/22  Yes Eustace Moore, MD  methylPREDNISolone (MEDROL DOSEPAK) 4 MG TBPK tablet tad 09/23/22  Yes Eustace Moore, MD  albuterol (VENTOLIN HFA) 108 (90 Base) MCG/ACT inhaler Inhale 1-2 puffs into the lungs every 4 (four) hours as needed for wheezing or shortness of breath. Patient not taking: Reported on 09/23/2022 11/08/21   Jomarie Longs, PA-C  buPROPion (WELLBUTRIN XL) 300 MG 24 hr tablet Take 1 tablet (300 mg total)  by mouth daily. 07/21/22   Breeback, Jade L, PA-C  diazepam (VALIUM) 5 MG tablet Take 1 tablet (5 mg total) by mouth every 12 (twelve) hours as needed for anxiety. 05/22/21   Breeback, Jade L, PA-C  montelukast (SINGULAIR) 10 MG tablet Take 1 tablet (10 mg total) by mouth at bedtime. 01/14/22   Breeback, Jade L, PA-C  sertraline (ZOLOFT) 100 MG tablet Take 1 tablet (100 mg total) by mouth daily. 07/07/22   Breeback, Jade L, PA-C  WEGOVY 1 MG/0.5ML SOAJ Inject 1 mg into the skin once a week. Use this dose for 1 month (4 shots) and then increase to next higher dose. 03/14/22   Breeback, Jade L,  PA-C  WEGOVY 1.7 MG/0.75ML SOAJ Inject 1.7 mg into the skin once a week. Use this dose for 1 month (4 shots) and then increase to next higher dose. 03/24/22   Breeback, Jade L, PA-C  WEGOVY 2.4 MG/0.75ML SOAJ Inject 2.4 mg into the skin once a week. 03/24/22   Jomarie Longs, PA-C    Family History Family History  Adopted: Yes  Problem Relation Age of Onset   Allergic rhinitis Neg Hx    Angioedema Neg Hx    Asthma Neg Hx    Atopy Neg Hx    Eczema Neg Hx    Immunodeficiency Neg Hx    Urticaria Neg Hx     Social History Social History   Tobacco Use   Smoking status: Never   Smokeless tobacco: Never  Vaping Use   Vaping Use: Never used  Substance Use Topics   Alcohol use: Yes    Alcohol/week: 6.0 standard drinks of alcohol    Types: 6 Glasses of wine per week   Drug use: Yes    Types: Marijuana    Comment: Jule or vape pen     Allergies   Sulfa antibiotics   Review of Systems Review of Systems See HPI  Physical Exam Triage Vital Signs ED Triage Vitals  Enc Vitals Group     BP 09/23/22 0846 108/76     Pulse Rate 09/23/22 0846 86     Resp 09/23/22 0846 16     Temp 09/23/22 0846 98.5 F (36.9 C)     Temp Source 09/23/22 0846 Oral     SpO2 09/23/22 0846 98 %     Weight 09/23/22 0848 190 lb (86.2 kg)     Height 09/23/22 0848 5\' 4"  (1.626 m)     Head Circumference --      Peak Flow --      Pain Score 09/23/22 0848 5     Pain Loc --      Pain Edu? --      Excl. in GC? --    No data found.  Updated Vital Signs BP 108/76 (BP Location: Right Arm)   Pulse 86   Temp 98.5 F (36.9 C) (Oral)   Resp 16   Ht 5\' 4"  (1.626 m)   Wt 86.2 kg   LMP 09/21/2022 (Exact Date)   SpO2 98%   BMI 32.61 kg/m      Physical Exam Constitutional:      General: She is not in acute distress.    Appearance: She is well-developed. She is obese. She is ill-appearing.  HENT:     Head: Normocephalic and atraumatic.     Right Ear: Tympanic membrane and ear canal normal.      Left Ear: Tympanic membrane and ear canal normal.  Nose: Nose normal.     Mouth/Throat:     Pharynx: Posterior oropharyngeal erythema present.  Eyes:     Conjunctiva/sclera: Conjunctivae normal.     Pupils: Pupils are equal, round, and reactive to light.  Cardiovascular:     Rate and Rhythm: Normal rate.     Heart sounds: Normal heart sounds.  Pulmonary:     Effort: Pulmonary effort is normal. No respiratory distress.     Breath sounds: Wheezing and rhonchi present.  Abdominal:     General: There is no distension.     Palpations: Abdomen is soft.  Musculoskeletal:        General: Normal range of motion.     Cervical back: Normal range of motion.  Lymphadenopathy:     Cervical: No cervical adenopathy.  Skin:    General: Skin is warm and dry.  Neurological:     Mental Status: She is alert.  Psychiatric:        Behavior: Behavior normal.      UC Treatments / Results  Labs (all labs ordered are listed, but only abnormal results are displayed) Labs Reviewed - No data to display  EKG   Radiology No results found.  Procedures Procedures (including critical care time)  Medications Ordered in UC Medications - No data to display  Initial Impression / Assessment and Plan / UC Course  I have reviewed the triage vital signs and the nursing notes.  Pertinent labs & imaging results that were available during my care of the patient were reviewed by me and considered in my medical decision making (see chart for details).     Patient states that she usually needs prednisone with her respiratory infections.  With her wheezing I agree that this is probably going to be helpful.  I am going to give her Tessalon for the coughing.  Refill her albuterol.  She feels like she may need an antibiotic to clear this infection.  I told her I wanted her to try conservative treatment for couple more days but gave her a prescription for an antibiotic in case she needs it while out of town.   She will follow-up with her PCP Final Clinical Impressions(s) / UC Diagnoses   Final diagnoses:  Viral URI with cough  Moderate persistent asthma with acute exacerbation     Discharge Instructions      Continue with your usual asthma medications and inhalers I have refilled your albuterol solution Take the prednisone as directed I have prescribed Tessalon to take as needed for cough You have a written prescription for azithromycin.  Fill and take this if you fail to improve with conservative treatment Follow-up with Lesly Rubenstein next week if needed   ED Prescriptions     Medication Sig Dispense Auth. Provider   methylPREDNISolone (MEDROL DOSEPAK) 4 MG TBPK tablet tad 21 tablet Eustace Moore, MD   benzonatate (TESSALON) 200 MG capsule Take 1 capsule (200 mg total) by mouth 2 (two) times daily as needed for cough. 20 capsule Eustace Moore, MD   azithromycin (ZITHROMAX Z-PAK) 250 MG tablet Take two pills today followed by one a day until gone 6 tablet Eustace Moore, MD   albuterol (PROVENTIL) (2.5 MG/3ML) 0.083% nebulizer solution Take 3 mLs (2.5 mg total) by nebulization every 6 (six) hours as needed for wheezing or shortness of breath. 75 mL Eustace Moore, MD      PDMP not reviewed this encounter.   Eustace Moore, MD 09/23/22 [phone]

## 2022-09-23 NOTE — ED Triage Notes (Addendum)
Cough since Friday  Tmax 102.3 on Sunday  OTC cold meds  Sore throat  Ear pain  Pt would like a steroid pack - in an outdoor wedding  in [place] this weekend  Pt  has had pneumonia in past  & wants to rule it out

## 2022-09-24 ENCOUNTER — Telehealth: Payer: Self-pay

## 2022-09-24 NOTE — Telephone Encounter (Signed)
TC to f/u after yesterday's visit to [facility]. No answer; left VM to call [phone] for problems or questions. 

## 2022-10-07 ENCOUNTER — Encounter: Payer: Self-pay | Admitting: Physician Assistant

## 2022-10-07 ENCOUNTER — Ambulatory Visit: Payer: BC Managed Care – PPO | Admitting: Physician Assistant

## 2022-10-07 VITALS — BP 117/76 | HR 85 | Ht 64.0 in | Wt 197.7 lb

## 2022-10-07 DIAGNOSIS — T753XXD Motion sickness, subsequent encounter: Secondary | ICD-10-CM

## 2022-10-07 DIAGNOSIS — F50819 Binge eating disorder, unspecified: Secondary | ICD-10-CM | POA: Insufficient documentation

## 2022-10-07 DIAGNOSIS — F3341 Major depressive disorder, recurrent, in partial remission: Secondary | ICD-10-CM

## 2022-10-07 DIAGNOSIS — E6609 Other obesity due to excess calories: Secondary | ICD-10-CM | POA: Diagnosis not present

## 2022-10-07 DIAGNOSIS — Z6833 Body mass index (BMI) 33.0-33.9, adult: Secondary | ICD-10-CM

## 2022-10-07 DIAGNOSIS — F5081 Binge eating disorder: Secondary | ICD-10-CM

## 2022-10-07 DIAGNOSIS — R41840 Attention and concentration deficit: Secondary | ICD-10-CM | POA: Diagnosis not present

## 2022-10-07 DIAGNOSIS — R058 Other specified cough: Secondary | ICD-10-CM

## 2022-10-07 MED ORDER — BUPROPION HCL ER (XL) 300 MG PO TB24: 300.0000 mg | ORAL_TABLET | Freq: Every day | ORAL | 1 refills | Status: DC

## 2022-10-07 MED ORDER — AMBULATORY NON FORMULARY MEDICATION: 1 refills | Status: DC

## 2022-10-07 MED ORDER — SERTRALINE HCL 100 MG PO TABS: 100.0000 mg | ORAL_TABLET | Freq: Every day | ORAL | 1 refills | Status: DC

## 2022-10-07 MED ORDER — SCOPOLAMINE 1 MG/3DAYS TD PT72: 1.0000 | MEDICATED_PATCH | TRANSDERMAL | 0 refills | Status: DC

## 2022-10-07 MED ORDER — AMBULATORY NON FORMULARY MEDICATION: 0 refills | Status: DC

## 2022-10-07 MED ORDER — LISDEXAMFETAMINE DIMESYLATE 30 MG PO CAPS: 30.0000 mg | ORAL_CAPSULE | Freq: Every day | ORAL | 0 refills | Status: DC

## 2022-10-07 NOTE — Patient Instructions (Signed)
[facility] in [place]

## 2022-10-07 NOTE — Progress Notes (Signed)
Established Patient Office Visit  Subjective   Patient ID: [name], female    DOB: 1986/03/26  Age: 36 y.o. MRN: [id number]  Chief Complaint  Patient presents with   Weight Loss    HPI Pt is a 36 yo obese female who needs wegovy sent to compounding pharmacy. She is doing well but cannot afford monthly price with insurance. Not taken in 2-4 weeks. She has gained 8lbs. She continues to have binge eating episodes where she feels like she cannot control herself. She also wonders about ADD. Long hx of poor concentration and task completion. She was told by advisor at work to consider testing.   Pt needs patches for motion sickness due to upcoming trip to [place].   Pt finished zpak and prednisone and feels much better but still has cough. She wants to make sure her lungs sound good today.    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   Class 1 obesity due to excess calories without serious comorbidity with body mass index (BMI) of 33.0 to 33.9 in adult 07/12/2013   Preventive measure 07/12/2013   Seasonal and perennial allergic rhinitis 07/12/2013   History of migraine headaches 01/04/2015   Tendinitis, de Quervain's 01/04/2015   PMDD (premenstrual dysphoric disorder) 12/04/2016   Sebaceous cyst 01/28/2017   Inattention 01/28/2017   No energy 01/28/2017   Recurrent candidiasis of vagina 05/05/2017   Abnormal weight gain 05/05/2017   Foreign body in auricle of right ear 05/05/2017   Motion sickness 11/18/2017   Plantar fasciitis, right 01/26/2018   Migraines 05/19/2016   History of recurrent pneumonia 11/05/2018   Anxiety 12/22/2018   Marijuana use, episodic 12/22/2018   Cough 12/22/2018   Insomnia due to other mental disorder 05/04/2020   Childhood asthma 09/11/2020   Moderate persistent asthma 09/11/2020   Allergies 11/02/2020   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    Objective:     BP 117/76 (BP Location: Left Arm, Patient Position: Sitting, Cuff Size: Large)   Pulse 85   Ht 5\' 4"  (1.626 m)   Wt 197 lb 11.2 oz (89.7 kg)   LMP 09/21/2022 (Exact Date)   SpO2 94%   BMI 33.94 kg/m  BP Readings from Last 3 Encounters:  10/07/22 117/76  09/23/22 108/76  06/23/22 127/87   Wt Readings from Last 3 Encounters:  10/07/22 197 lb 11.2 oz (89.7 kg)  09/23/22 190 lb (86.2 kg)  06/23/22 193 lb (87.5 kg)    .08/23/22 Adult ADHD Self Report Scale (most recent)     Adult ADHD Self-Report Scale (ASRS-v1.1) Symptom Checklist - 10/07/22 0918       Part A   1. How often do you have trouble wrapping up the final details of a project, once the challenging parts have been done? Often  2. How often do you have difficulty getting things done in order when you have to do a task that requires organization? Often  3. How often do you have problems remembering appointments or obligations? Sometimes  4. When you have a task that requires a lot of thought, how often do you avoid or delay getting started? Very Often    5. How often do you fidget or squirm with your hands or feet when you have to sit down for a long time? Very Often  6. How often do you feel overly active and compelled to do things, like you were driven by a motor? Often      Part B   7. How often do you make careless mistakes when you have to work on a boring or difficult project? Often  8. How often do you have difficulty keeping your attention when you are doing boring or repetitive work? Very Often     9. How often do you have difficulty concentrating on what people say to you, even when they are speaking to you directly? Very Often  10. How often do you misplace or have difficulty finding things at home or at work? Very Often    15. How often are you distracted by activity or noise around you? Very Often  50. How often do you leave your seat in meetings or other situations in which you are expected to remain seated? Sometimes    13. How often do you feel restless or fidgety? Sometimes  14. How often do you have difficulty unwinding and relaxing when you have time to yourself? Often    15. How often do you find yourself talking too much when you are in social situations? Sometimes  16. When you are in a conversation, how often do you find yourself finishing the sentences of the people you are talking to, before they can finish them themselves? Sometimes    17. How often do you have difficulty waiting your turn in situations when turn taking is required? Sometimes  18. How often do you interrupt others when they are busy? Sometimes      Comment   How old were you when these problems first began to occur? 12              ..    10/07/2022    8:51 AM 03/24/2022    8:16 AM 11/08/2021    1:47 PM 07/02/2021   11:25 AM 05/02/2020    9:46 AM  Depression screen PHQ 2/9  Decreased Interest 1 0 0 1 0  Down, Depressed, Hopeless 0 0 0 0 0  PHQ - 2 Score 1 0 0 1 0  Altered sleeping 0   2 0  Tired, decreased energy 1   2 1   Change in appetite 0   1 3  Feeling bad or failure about yourself  0   0 0  Trouble concentrating 2   2 3   Moving slowly or fidgety/restless 1   0 0  Suicidal thoughts 0   0 0  PHQ-9 Score 5   8 7   Difficult doing work/chores    Somewhat difficult    ..    10/07/2022    8:52 AM 07/02/2021   11:26 AM 05/02/2020    9:46 AM 08/29/2019   10:08 AM  GAD 7 : Generalized Anxiety Score  Nervous, Anxious, on Edge 0 3 0 3  Control/stop worrying 0 1 1 3   Worry too much - different  things 0 2 1 3   Trouble relaxing 0 1 0 3  Restless 0 0 0 3  Easily annoyed or  irritable 1 2 0 3  Afraid - awful might happen 0 0 1 1  Total GAD 7 Score 1 9 3 19   Anxiety Difficulty Not difficult at all Somewhat difficult Not difficult at all Very difficult      Physical Exam Constitutional:      Appearance: Normal appearance. She is obese.  HENT:     Head: Normocephalic.  Cardiovascular:     Rate and Rhythm: Normal rate and regular rhythm.  Pulmonary:     Effort: Pulmonary effort is normal.     Breath sounds: Normal breath sounds.  Neurological:     General: No focal deficit present.     Mental Status: She is alert and oriented to person, place, and time.  Psychiatric:        Mood and Affect: Mood normal.      .    Assessment & Plan:  Marland KitchenMarland KitchenCrystal was seen today for weight loss.  Diagnoses and all orders for this visit:  Class 1 obesity due to excess calories without serious comorbidity with body mass index (BMI) of 33.0 to 33.9 in adult -     AMBULATORY NON FORMULARY MEDICATION; Semaglutide 2.5mg  with pyridoxine 10mg  per mL Inject 1.2mg  once weekly -     AMBULATORY NON FORMULARY MEDICATION; Semaglutide 2.5mg  with pyridoxine 10mg  per mL Inject .6mg  once weekly  Binge eating disorder -     lisdexamfetamine (VYVANSE) 30 MG capsule; Take 1 capsule (30 mg total) by mouth daily.  Recurrent major depressive disorder, in partial remission (HCC) -     buPROPion (WELLBUTRIN XL) 300 MG 24 hr tablet; Take 1 tablet (300 mg total) by mouth daily. -     sertraline (ZOLOFT) 100 MG tablet; Take 1 tablet (100 mg total) by mouth daily.  Inattention  Poor concentration  Motion sickness, subsequent encounter -     scopolamine (TRANSDERM-SCOP) 1 MG/3DAYS; Place 1 patch (1.5 mg total) onto the skin every 3 (three) days.  Post-viral cough syndrome   BMI 33 [facility] Follow up in 3 months  Vyvanse for binge eating disorder and ADD symptoms Positive  ADD screening today  Discussed side effects of medications Follow up in 1 month  Patches for motion sickness sent to pharmacy  Lungs sound great Vitals look good Reassurance given for post viral cough    Iran Planas, PA-C

## 2022-10-21 ENCOUNTER — Encounter: Payer: Self-pay | Admitting: Physician Assistant

## 2022-10-21 DIAGNOSIS — F5081 Binge eating disorder: Secondary | ICD-10-CM

## 2022-10-22 MED ORDER — VYVANSE 30 MG PO CAPS: 30.0000 mg | ORAL_CAPSULE | Freq: Every day | ORAL | 0 refills | Status: DC

## 2022-11-05 DIAGNOSIS — F4323 Adjustment disorder with mixed anxiety and depressed mood: Secondary | ICD-10-CM | POA: Diagnosis not present

## 2022-11-12 DIAGNOSIS — F4323 Adjustment disorder with mixed anxiety and depressed mood: Secondary | ICD-10-CM | POA: Diagnosis not present

## 2022-11-14 ENCOUNTER — Encounter: Payer: Self-pay | Admitting: Physician Assistant

## 2022-11-14 ENCOUNTER — Ambulatory Visit (INDEPENDENT_AMBULATORY_CARE_PROVIDER_SITE_OTHER): Payer: BC Managed Care – PPO | Admitting: Physician Assistant

## 2022-11-14 VITALS — BP 115/67 | HR 80 | Temp 98.0°F | Ht 64.0 in | Wt 192.0 lb

## 2022-11-14 DIAGNOSIS — J019 Acute sinusitis, unspecified: Secondary | ICD-10-CM | POA: Diagnosis not present

## 2022-11-14 DIAGNOSIS — J4540 Moderate persistent asthma, uncomplicated: Secondary | ICD-10-CM

## 2022-11-14 DIAGNOSIS — B9689 Other specified bacterial agents as the cause of diseases classified elsewhere: Secondary | ICD-10-CM | POA: Diagnosis not present

## 2022-11-14 MED ORDER — BUDESONIDE-FORMOTEROL FUMARATE 160-4.5 MCG/ACT IN AERO: 2.0000 | INHALATION_SPRAY | Freq: Two times a day (BID) | RESPIRATORY_TRACT | 3 refills | Status: DC

## 2022-11-14 MED ORDER — PREDNISONE 20 MG PO TABS: ORAL_TABLET | ORAL | 0 refills | Status: DC

## 2022-11-14 MED ORDER — DOXYCYCLINE HYCLATE 100 MG PO TABS: 100.0000 mg | ORAL_TABLET | Freq: Two times a day (BID) | ORAL | 0 refills | Status: DC

## 2022-11-14 NOTE — Progress Notes (Signed)
Acute Office Visit  Subjective:     Patient ID: Michelle Berger, female    DOB: [birth year], 37 y.o.   MRN: [id number]  Chief Complaint  Patient presents with   Nasal Congestion    HPI Patient is in today for congestion.   She states she feels like she has been sick for 2 months. She was in the ED on 09/23/2022 with cough, fever, and chest tightness. She was diagnosed with a viral URI and asthma exacerbation and given a Medrol dose pak, Tessalon pearls and a Z-pak if symptoms did not clear in a few days. She states she did recover from this but was sick again over Christmas. Symptoms at that time were fever, myalgias, and severe fatigue. She states this also got better after about a week.  Now, more recently in the last week, she has had nasal congestion, rhinorrhea, ear fullness, cough with yellow/green sputum and SOB. She describes the ear fullness as feeling like she under water. She has been using her albuterol inhaler 4x/day in the last week. Denies fever, chills, myalgias, chest pain, abdominal pain, nausea, vomiting. She has tried Dayquil and Nyquil which does provide some relief. She states Tylenol Cold and Sinus did not help. She has not been using a nasal spray.    Review of Systems  Constitutional:  Positive for malaise/fatigue. Negative for chills and fever.  HENT:  Positive for congestion, ear pain, sinus pain and sore throat. Negative for ear discharge.   Eyes:  Negative for discharge and redness.  Respiratory:  Positive for cough, sputum production, shortness of breath and wheezing. Negative for hemoptysis.   Cardiovascular:  Negative for chest pain.  Gastrointestinal:  Negative for abdominal pain, nausea and vomiting.  Musculoskeletal:  Negative for myalgias.  Neurological:  Positive for dizziness. Negative for headaches.        Objective:    BP 115/67   Pulse 80   Temp 98 F (36.7 C) (Oral)   Ht 5\' 4"  (1.626 m)   Wt 192 lb (87.1 kg)   SpO2 100%    BMI 32.96 kg/m  BP Readings from Last 3 Encounters:  11/14/22 115/67  10/07/22 117/76  09/23/22 108/76   Wt Readings from Last 3 Encounters:  11/14/22 192 lb (87.1 kg)  10/07/22 197 lb 11.2 oz (89.7 kg)  09/23/22 190 lb (86.2 kg)      Physical Exam Constitutional:      Appearance: She is ill-appearing.  HENT:     Head: Normocephalic and atraumatic.     Right Ear: Tympanic membrane, ear canal and external ear normal.     Left Ear: Tympanic membrane, ear canal and external ear normal.     Nose: Congestion and rhinorrhea present.     Right Turbinates: Enlarged.     Left Turbinates: Enlarged.     Right Sinus: Maxillary sinus tenderness present. No frontal sinus tenderness.     Left Sinus: Maxillary sinus tenderness present. No frontal sinus tenderness.     Mouth/Throat:     Pharynx: Uvula midline. Posterior oropharyngeal erythema present. No oropharyngeal exudate.     Tonsils: No tonsillar exudate.  Eyes:     Conjunctiva/sclera: Conjunctivae normal.  Cardiovascular:     Rate and Rhythm: Normal rate and regular rhythm.     Heart sounds: No murmur heard. Pulmonary:     Breath sounds: Normal breath sounds. No wheezing or rhonchi.  Neurological:     Mental Status: She is alert and oriented to person,  place, and time.         Assessment & Plan:  Marland KitchenMarland KitchenCrystal was seen today for nasal congestion.  Diagnoses and all orders for this visit:  Acute bacterial rhinosinusitis -     doxycycline (VIBRA-TABS) 100 MG tablet; Take 1 tablet (100 mg total) by mouth 2 (two) times daily. -     predniSONE (DELTASONE) 20 MG tablet; Take 3 tablets for 3 days, take 2 tablets for 3 days, take 1 tablet for 3 days, take one-half tablet for 4 days.  Moderate persistent asthma, unspecified whether complicated -     budesonide-formoterol (SYMBICORT) 160-4.5 MCG/ACT inhaler; Inhale 2 puffs into the lungs 2 (two) times daily.  Other orders -     Discontinue: budesonide-formoterol (SYMBICORT) 160-4.5  MCG/ACT inhaler; Inhale 2 puffs into the lungs 2 (two) times daily. -     Discontinue: budesonide-formoterol (SYMBICORT) 160-4.5 MCG/ACT inhaler; Inhale 2 puffs into the lungs 2 (two) times daily.      Likely bacterial rhinosinusitis with secondary asthma exacerbation. No significant wheezing noted on exam today. Recommend a course of doxycycline and a longer prednisone taper. We also discussed that she may need to be on an ICS-LABA inhaler during the winter months as she is prone to infection during this time. She is hesitant to start it since her asthma was under fairly good control prior to this. I have sent a prescription for Symbicort to her pharmacy in case she eventually decides to do it. Otherwise, continue albuterol prn and daily Singulair. Follow up if symptoms worsen or fail to improve.   Iran Planas, PA-C

## 2022-11-14 NOTE — Patient Instructions (Signed)

## 2022-11-19 MED ORDER — LISDEXAMFETAMINE DIMESYLATE 30 MG PO CAPS: 30.0000 mg | ORAL_CAPSULE | Freq: Every day | ORAL | 0 refills | Status: DC

## 2022-11-19 NOTE — Addendum Note (Signed)
Addended by: Narda Rutherford on: 11/19/2022 11:38 AM   Modules accepted: Orders

## 2022-11-19 NOTE — Addendum Note (Signed)
Addended by: Donella Stade on: 11/19/2022 12:59 PM   Modules accepted: Orders

## 2022-11-26 DIAGNOSIS — F4323 Adjustment disorder with mixed anxiety and depressed mood: Secondary | ICD-10-CM | POA: Diagnosis not present

## 2022-12-03 DIAGNOSIS — F4323 Adjustment disorder with mixed anxiety and depressed mood: Secondary | ICD-10-CM | POA: Diagnosis not present

## 2022-12-17 DIAGNOSIS — F4323 Adjustment disorder with mixed anxiety and depressed mood: Secondary | ICD-10-CM | POA: Diagnosis not present

## 2023-01-05 ENCOUNTER — Other Ambulatory Visit: Payer: Self-pay | Admitting: Physician Assistant

## 2023-01-05 DIAGNOSIS — F5081 Binge eating disorder: Secondary | ICD-10-CM

## 2023-01-05 MED ORDER — LISDEXAMFETAMINE DIMESYLATE 30 MG PO CAPS: 30.0000 mg | ORAL_CAPSULE | Freq: Every day | ORAL | 0 refills | Status: DC

## 2023-01-08 IMAGING — DX DG CHEST 2V
2 series · 2 of 2 positions shown · non-contrast
Comparison: 09/07/2020

CLINICAL DATA: Cough, pre-surgical clearance

EXAM:
CHEST - 2 VIEW

[chest pa]
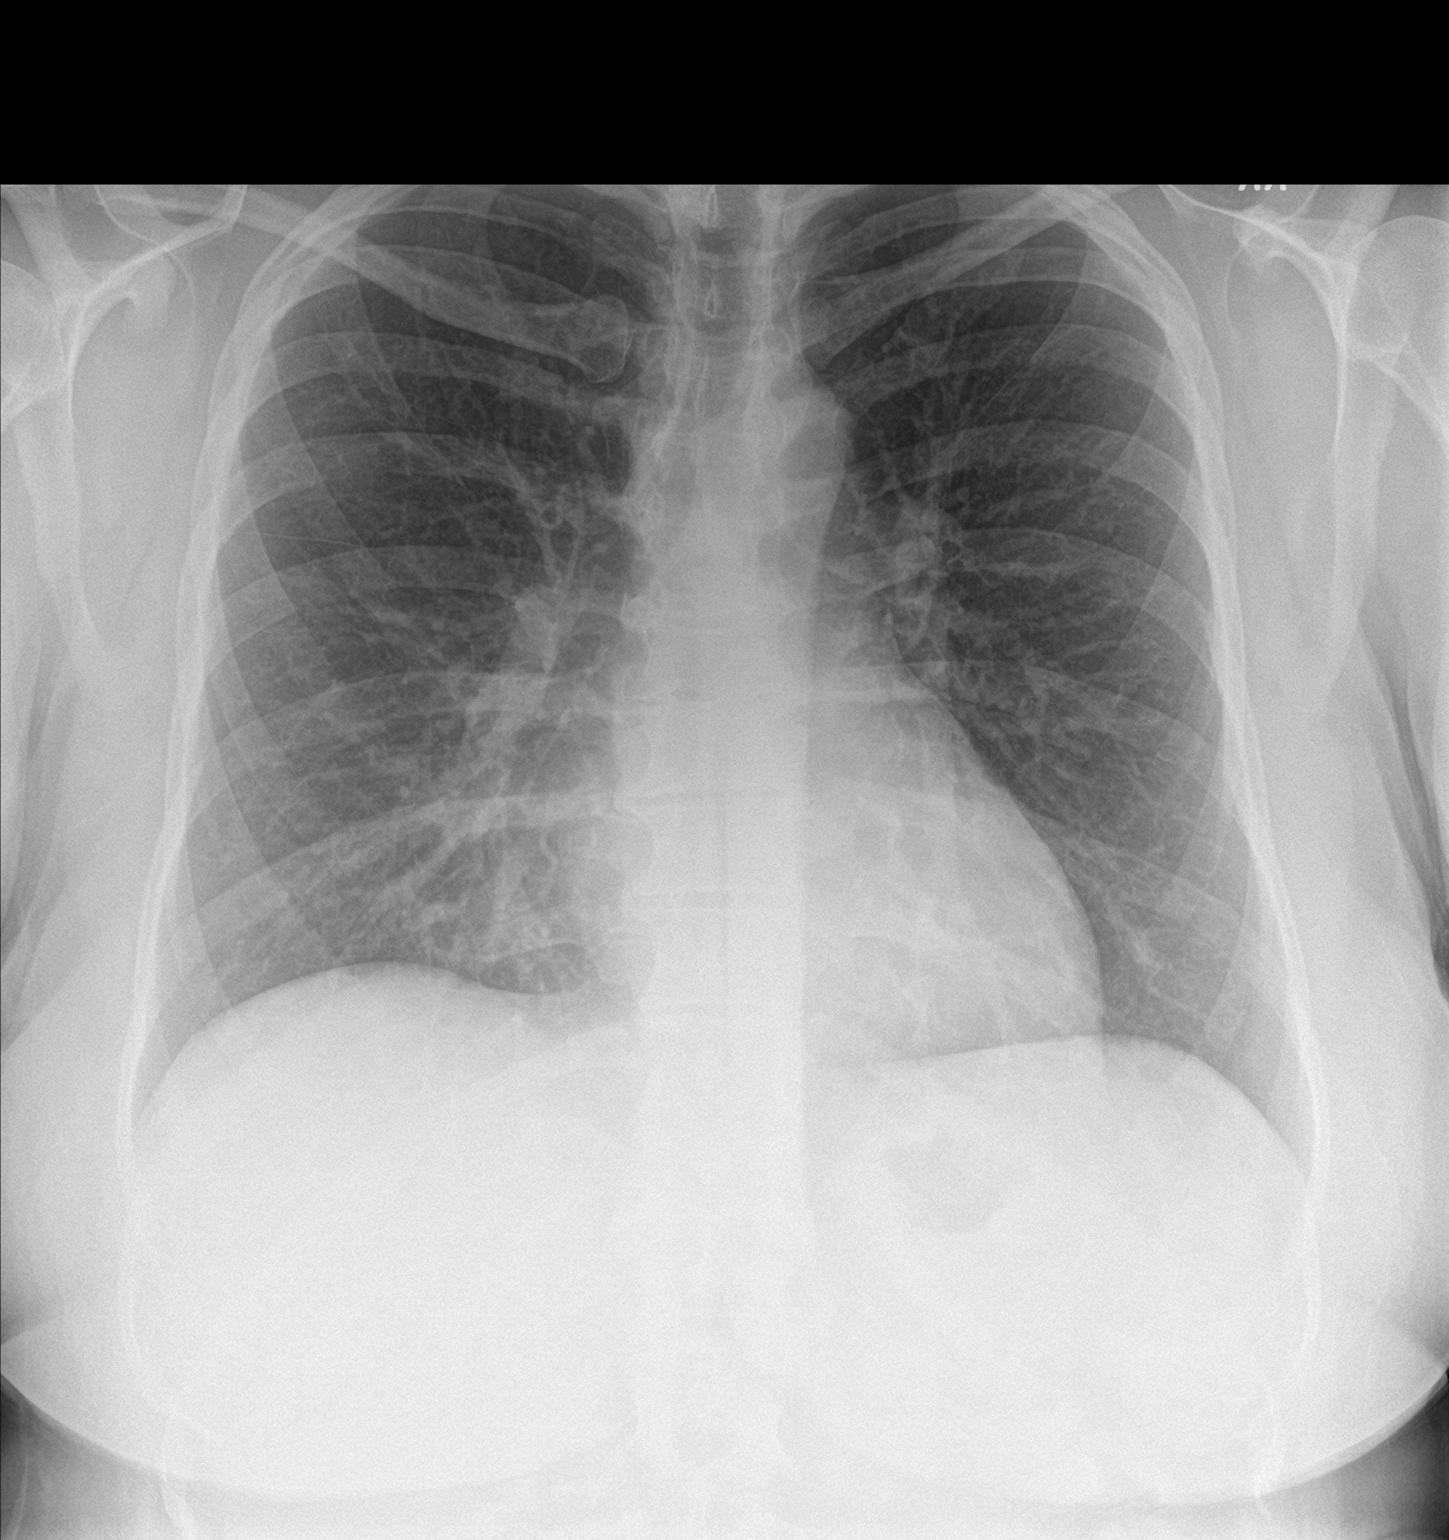

[chest lat]
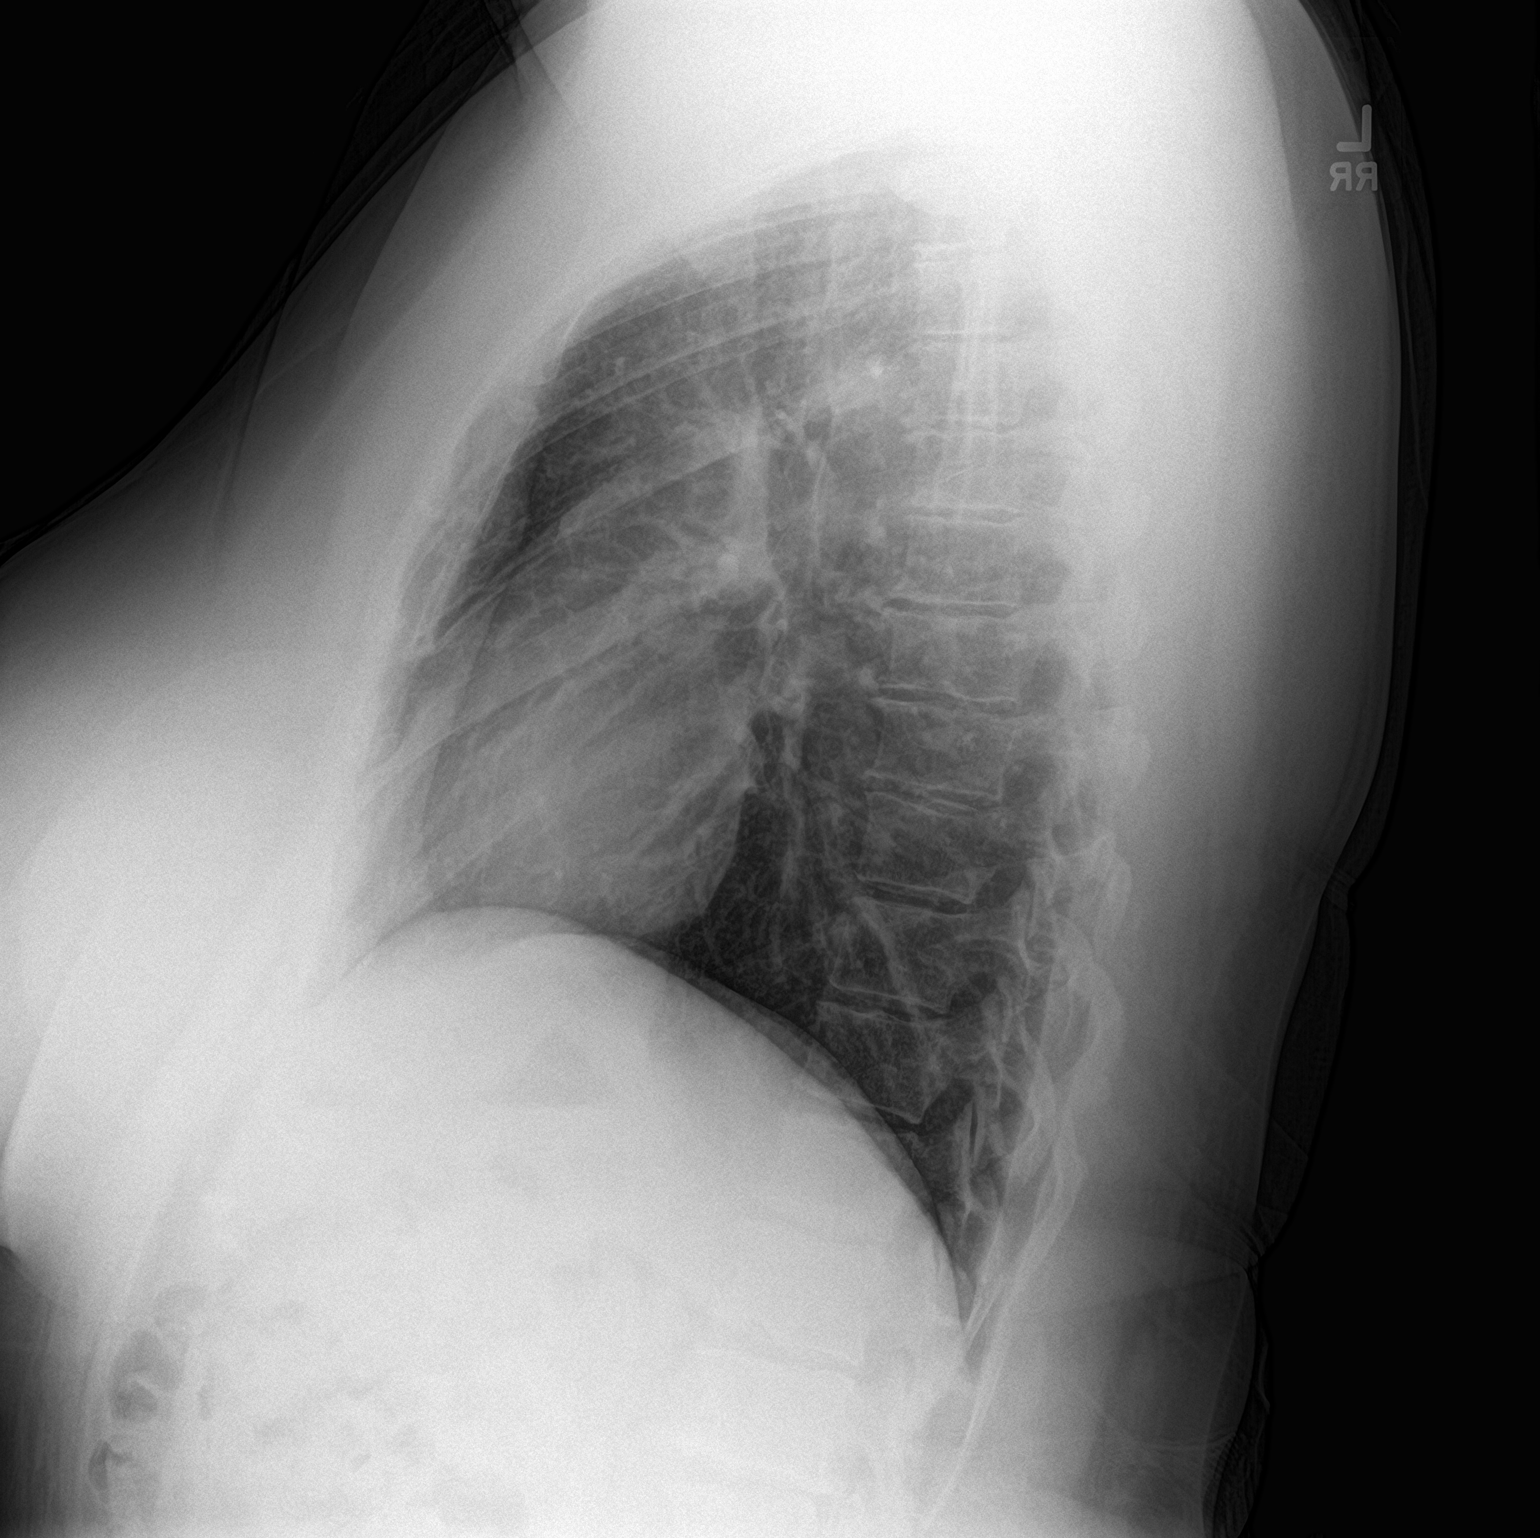

[2 of 2 positions shown; findings below may reference images not displayed]

FINDINGS: The heart size and mediastinal contours are within normal limits.
Both lungs are clear. The visualized skeletal structures are
unremarkable.
IMPRESSION: No acute abnormality of the lungs.

## 2023-01-20 ENCOUNTER — Encounter: Payer: Self-pay | Admitting: Physician Assistant

## 2023-01-31 ENCOUNTER — Other Ambulatory Visit: Payer: Self-pay | Admitting: Physician Assistant

## 2023-01-31 DIAGNOSIS — J4540 Moderate persistent asthma, uncomplicated: Secondary | ICD-10-CM

## 2023-01-31 DIAGNOSIS — T7840XS Allergy, unspecified, sequela: Secondary | ICD-10-CM

## 2023-01-31 DIAGNOSIS — R059 Cough, unspecified: Secondary | ICD-10-CM

## 2023-02-02 ENCOUNTER — Ambulatory Visit: Payer: Self-pay | Admitting: Physician Assistant

## 2023-02-02 VITALS — BP 135/73 | HR 94 | Ht 64.0 in | Wt 209.0 lb

## 2023-02-02 DIAGNOSIS — E6609 Other obesity due to excess calories: Secondary | ICD-10-CM

## 2023-02-02 DIAGNOSIS — Z6835 Body mass index (BMI) 35.0-35.9, adult: Secondary | ICD-10-CM

## 2023-02-02 DIAGNOSIS — F5081 Binge eating disorder: Secondary | ICD-10-CM

## 2023-02-02 DIAGNOSIS — F3341 Major depressive disorder, recurrent, in partial remission: Secondary | ICD-10-CM

## 2023-02-02 DIAGNOSIS — F50819 Binge eating disorder, unspecified: Secondary | ICD-10-CM

## 2023-02-02 MED ORDER — BUPROPION HCL ER (XL) 300 MG PO TB24
300.0000 mg | ORAL_TABLET | Freq: Every day | ORAL | 1 refills | Status: DC
Start: 2023-02-02 — End: 2023-04-28

## 2023-02-02 MED ORDER — WEGOVY 0.5 MG/0.5ML ~~LOC~~ SOAJ
0.5000 mg | SUBCUTANEOUS | 0 refills | Status: DC
Start: 2023-02-02 — End: 2023-11-04

## 2023-02-02 MED ORDER — WEGOVY 0.25 MG/0.5ML ~~LOC~~ SOAJ
0.2500 mg | SUBCUTANEOUS | 0 refills | Status: DC
Start: 2023-02-02 — End: 2023-11-04

## 2023-02-02 MED ORDER — SERTRALINE HCL 100 MG PO TABS
100.0000 mg | ORAL_TABLET | Freq: Every day | ORAL | 1 refills | Status: DC
Start: 2023-02-02 — End: 2023-05-01

## 2023-02-02 MED ORDER — WEGOVY 1 MG/0.5ML ~~LOC~~ SOAJ
1.0000 mg | SUBCUTANEOUS | 0 refills | Status: DC
Start: 2023-02-02 — End: 2023-05-25

## 2023-02-02 MED ORDER — LISDEXAMFETAMINE DIMESYLATE 30 MG PO CAPS
30.0000 mg | ORAL_CAPSULE | Freq: Every day | ORAL | 0 refills | Status: DC
Start: 2023-04-04 — End: 2023-03-03

## 2023-02-02 MED ORDER — LISDEXAMFETAMINE DIMESYLATE 30 MG PO CAPS
30.0000 mg | ORAL_CAPSULE | Freq: Every day | ORAL | 0 refills | Status: DC
Start: 2023-03-04 — End: 2023-03-03

## 2023-02-02 MED ORDER — LISDEXAMFETAMINE DIMESYLATE 30 MG PO CAPS
30.0000 mg | ORAL_CAPSULE | Freq: Every day | ORAL | 0 refills | Status: DC
Start: 2023-02-02 — End: 2023-08-17

## 2023-02-04 ENCOUNTER — Encounter: Payer: Self-pay | Admitting: Physician Assistant

## 2023-02-04 NOTE — Progress Notes (Signed)
Established Patient Office Visit  Subjective   Patient ID: Michelle Berger, female    DOB: 05/06/1986  Age: 37 y.o. MRN: [id number]  Chief Complaint  Patient presents with   Follow-up    HPI Patient is a 37 year old obese female who presents to the clinic for medication follow-up.  Patient was started on Vyvanse for binge eating.  She does feel like this has been helpful.  She admits she is not taking it every day.  She thinks she might should.  She has not been taking it some days and she will find herself binge eating.  She denies any side effects.  She does feel more clearheaded and level when taking Vyvanse.  She denies any problems sleeping or problems with anxiety.  She would like to continue with weight loss.  She is exercising at least 3 times a week.  She has tried [place] in the past and it was very beneficial.  She would like to restart it today.  She tried [employer] at the compounding pharmacy and was finding it hard to inject herself with a real needle but she does not mind the pins.  Overall her mood is very well-controlled.  She is happy.  .. 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   Class 2 obesity due to excess calories without serious comorbidity with body mass index (BMI) of 35.0 to 35.9 in adult 07/12/2013   Preventive measure 07/12/2013   Seasonal and perennial allergic rhinitis 07/12/2013   History of migraine headaches 01/04/2015   Tendinitis, de Quervain's 01/04/2015   PMDD (premenstrual dysphoric disorder) 12/04/2016   Sebaceous cyst 01/28/2017   Inattention 01/28/2017   No energy 01/28/2017   Recurrent candidiasis of vagina 05/05/2017   Abnormal weight gain 05/05/2017   Foreign body in auricle of right ear 05/05/2017   Motion sickness 11/18/2017   Plantar fasciitis, right 01/26/2018   Migraines 05/19/2016   History of recurrent pneumonia 11/05/2018   Anxiety 12/22/2018   Marijuana use, episodic 12/22/2018    Cough 12/22/2018   Insomnia due to other mental disorder 05/04/2020   Childhood asthma 09/11/2020   Moderate persistent asthma 09/11/2020   Allergies 11/02/2020   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    Objective:     BP 135/73   Pulse 94   Ht  (1.626 m)   Wt 209 lb (94.8 kg)   SpO2 99%   BMI 35.87 kg/m  BP Readings from Last 3 Encounters:  02/02/23 135/73  11/14/22 115/67  10/07/22 117/76   Wt Readings from Last 3 Encounters:  02/02/23 209 lb (94.8 kg)  11/14/22 192 lb (87.1 kg)  10/07/22 197 lb 11.2 oz (89.7 kg)      Physical Exam Constitutional:      Appearance: Normal appearance. She is obese.  HENT:     Head: Normocephalic.  Cardiovascular:     Rate and Rhythm: Normal rate and regular rhythm.     Pulses: Normal  pulses.     Heart sounds: Normal heart sounds.  Pulmonary:     Effort: Pulmonary effort is normal.  Musculoskeletal:     Cervical back: Normal range of motion and neck supple.  Neurological:     General: No focal deficit present.     Mental Status: She is alert and oriented to person, place, and time.  Psychiatric:        Mood and Affect: Mood normal.        Assessment & Plan:  Marland KitchenMarland KitchenCrystal was seen today for follow-up.  Diagnoses and all orders for this visit:  Binge eating disorder -     lisdexamfetamine (VYVANSE) 30 MG capsule; Take 1 capsule (30 mg total) by mouth daily. -     lisdexamfetamine (VYVANSE) 30 MG capsule; Take 1 capsule (30 mg total) by mouth daily. -      lisdexamfetamine (VYVANSE) 30 MG capsule; Take 1 capsule (30 mg total) by mouth daily. -     WEGOVY 1 MG/0.5ML SOAJ; Inject 1 mg into the skin once a week. Use this dose for 1 month (4 shots) and then increase to next higher dose.  Recurrent major depressive disorder, in partial remission -     buPROPion (WELLBUTRIN XL) 300 MG 24 hr tablet; Take 1 tablet (300 mg total) by mouth daily. -     sertraline (ZOLOFT) 100 MG tablet; Take 1 tablet (100 mg total) by mouth daily. -     WEGOVY 1 MG/0.5ML SOAJ; Inject 1 mg into the skin once a week. Use this dose for 1 month (4 shots) and then increase to next higher dose.  Class 2 obesity due to excess calories without serious comorbidity with body mass index (BMI) of 35.0 to 35.9 in adult -     buPROPion (WELLBUTRIN XL) 300 MG 24 hr tablet; Take 1 tablet (300 mg total) by mouth daily. -     WEGOVY 0.25 MG/0.5ML SOAJ; Inject 0.25 mg into the skin once a week. Use this dose for 1 month (4 shots) and then increase to next higher dose. -     WEGOVY 0.5 MG/0.5ML SOAJ; Inject 0.5 mg into the skin once a week. Use this dose for 1 month (4 shots) and then increase to next higher dose. -     WEGOVY 1 MG/0.5ML SOAJ; Inject 1 mg into the skin once a week. Use this dose for 1 month (4 shots) and then increase to next higher dose.  Vitals look great Pt does feel like vyvanse helps her to not binge eat Not lost weight but feels overall more control in her impulses to eat Refilled vyvanse She would like to add wegovy to help with weight loss as well Pt tolerated well in past Sent titration up to  Follow up in 6 months  Return in about 6 months (around 08/04/2023).    Tandy Gaw, PA-C

## 2023-03-02 ENCOUNTER — Other Ambulatory Visit: Payer: Self-pay | Admitting: Physician Assistant

## 2023-03-02 DIAGNOSIS — F5081 Binge eating disorder: Secondary | ICD-10-CM

## 2023-03-03 MED ORDER — LISDEXAMFETAMINE DIMESYLATE 30 MG PO CAPS: 30.0000 mg | ORAL_CAPSULE | Freq: Every day | ORAL | 0 refills | Status: DC

## 2023-03-03 MED ORDER — LISDEXAMFETAMINE DIMESYLATE 30 MG PO CAPS
30.0000 mg | ORAL_CAPSULE | Freq: Every day | ORAL | 0 refills | Status: DC
Start: 2023-04-03 — End: 2023-08-17

## 2023-03-03 NOTE — Telephone Encounter (Signed)
Resent vyvanse to [facility] pharmacy.

## 2023-04-11 ENCOUNTER — Other Ambulatory Visit: Payer: Self-pay | Admitting: Physician Assistant

## 2023-04-11 DIAGNOSIS — F3341 Major depressive disorder, recurrent, in partial remission: Secondary | ICD-10-CM

## 2023-04-23 ENCOUNTER — Other Ambulatory Visit: Payer: Self-pay | Admitting: Physician Assistant

## 2023-04-23 DIAGNOSIS — F3341 Major depressive disorder, recurrent, in partial remission: Secondary | ICD-10-CM

## 2023-04-23 DIAGNOSIS — E6609 Other obesity due to excess calories: Secondary | ICD-10-CM

## 2023-04-30 ENCOUNTER — Other Ambulatory Visit: Payer: Self-pay | Admitting: Physician Assistant

## 2023-04-30 DIAGNOSIS — F3341 Major depressive disorder, recurrent, in partial remission: Secondary | ICD-10-CM

## 2023-05-25 ENCOUNTER — Other Ambulatory Visit: Payer: Self-pay | Admitting: Physician Assistant

## 2023-05-25 DIAGNOSIS — E6609 Other obesity due to excess calories: Secondary | ICD-10-CM

## 2023-05-25 DIAGNOSIS — F3341 Major depressive disorder, recurrent, in partial remission: Secondary | ICD-10-CM

## 2023-05-25 DIAGNOSIS — F5081 Binge eating disorder: Secondary | ICD-10-CM

## 2023-05-25 MED ORDER — WEGOVY 1 MG/0.5ML ~~LOC~~ SOAJ
1.0000 mg | SUBCUTANEOUS | 0 refills | Status: DC
Start: 2023-05-25 — End: 2024-06-01

## 2023-08-14 ENCOUNTER — Encounter: Payer: Self-pay | Admitting: Physician Assistant

## 2023-08-14 DIAGNOSIS — F3341 Major depressive disorder, recurrent, in partial remission: Secondary | ICD-10-CM

## 2023-08-14 DIAGNOSIS — F50819 Binge eating disorder, unspecified: Secondary | ICD-10-CM

## 2023-08-14 MED ORDER — SERTRALINE HCL 100 MG PO TABS: 100.0000 mg | ORAL_TABLET | Freq: Every day | ORAL | 1 refills | Status: DC

## 2023-08-17 MED ORDER — LISDEXAMFETAMINE DIMESYLATE 30 MG PO CAPS: 30.0000 mg | ORAL_CAPSULE | Freq: Every day | ORAL | 0 refills | Status: DC

## 2023-08-17 MED ORDER — LISDEXAMFETAMINE DIMESYLATE 30 MG PO CAPS
30.0000 mg | ORAL_CAPSULE | Freq: Every day | ORAL | 0 refills | Status: DC
Start: 2023-10-16 — End: 2023-11-04

## 2023-08-17 NOTE — Addendum Note (Signed)
Addended by: Jomarie Longs on: 08/17/2023 11:51 AM   Modules accepted: Orders

## 2023-09-16 ENCOUNTER — Other Ambulatory Visit: Payer: Self-pay | Admitting: Physician Assistant

## 2023-09-16 ENCOUNTER — Telehealth: Payer: Self-pay

## 2023-09-16 DIAGNOSIS — F3341 Major depressive disorder, recurrent, in partial remission: Secondary | ICD-10-CM

## 2023-09-16 DIAGNOSIS — J4540 Moderate persistent asthma, uncomplicated: Secondary | ICD-10-CM

## 2023-09-16 DIAGNOSIS — R059 Cough, unspecified: Secondary | ICD-10-CM

## 2023-09-16 DIAGNOSIS — T7840XS Allergy, unspecified, sequela: Secondary | ICD-10-CM

## 2023-09-16 MED ORDER — MONTELUKAST SODIUM 10 MG PO TABS: 10.0000 mg | ORAL_TABLET | Freq: Every day | ORAL | 1 refills | Status: DC

## 2023-09-16 MED ORDER — SERTRALINE HCL 100 MG PO TABS: 100.0000 mg | ORAL_TABLET | Freq: Every day | ORAL | 1 refills | Status: DC

## 2023-09-16 NOTE — Telephone Encounter (Signed)
Copied from CRM [id number]. Topic: Clinical - Medication Refill >> Sep 16, 2023 10:18 AM Donita Brooks wrote: Most Recent Primary Care Visit:  Provider: Jomarie Longs  Department: [facility]  Visit Type: OFFICE VISIT  Date: 02/02/2023  Medication: Zoloft 100 mg singular 10mg  / Pt is leaving for out of town today around 12 and would like to receive medication before than   Has the patient contacted their pharmacy? Yes (Agent: If no, request that the patient contact the pharmacy for the refill. If patient does not wish to contact the pharmacy document the reason why and proceed with request.) (Agent: If yes, when and what did the pharmacy advise?)  Is this the correct pharmacy for this prescription? Yes If no, delete pharmacy and type the correct one.  This is the patient's preferred pharmacy:  [id number] - [place], [place] - [address] [place] [address] Phone: [phone] Fax: [phone]  [facility] - [place], [facility] - [facility] [place] [address] Phone: [phone] Fax: [phone]  [facility] - [place], [place] - [address] [place] [address] [place] [address] Phone: [phone] Fax: [phone]   Has the prescription been filled recently? No  Is the patient out of the medication? Yes  Has the patient been seen for an appointment in the last year OR does the patient have an upcoming appointment? No  Can we respond through MyChart? Yes  Agent: Please be advised that Rx refills may take up to 3 business days. We ask that you follow-up with your pharmacy.

## 2023-09-16 NOTE — Telephone Encounter (Signed)
Patient called about her getting her a medication refill on the following medications, sertraline (ZOLOFT) 100 MG tablet [[id number]] and montelukast (SINGULAIR) 10 MG tablet, patient will be going out of town today, please advise, thanks.

## 2023-10-01 ENCOUNTER — Other Ambulatory Visit: Payer: Self-pay | Admitting: Physician Assistant

## 2023-10-01 ENCOUNTER — Encounter: Payer: Self-pay | Admitting: Physician Assistant

## 2023-10-01 DIAGNOSIS — F419 Anxiety disorder, unspecified: Secondary | ICD-10-CM

## 2023-10-01 DIAGNOSIS — F50819 Binge eating disorder, unspecified: Secondary | ICD-10-CM

## 2023-10-02 MED ORDER — DIAZEPAM 5 MG PO TABS: 5.0000 mg | ORAL_TABLET | Freq: Two times a day (BID) | ORAL | 1 refills | Status: DC | PRN

## 2023-10-02 MED ORDER — LISDEXAMFETAMINE DIMESYLATE 30 MG PO CAPS: 30.0000 mg | ORAL_CAPSULE | Freq: Every day | ORAL | 0 refills | Status: DC

## 2023-11-04 ENCOUNTER — Ambulatory Visit: Payer: Self-pay | Admitting: Physician Assistant

## 2023-11-04 ENCOUNTER — Encounter: Payer: Self-pay | Admitting: Physician Assistant

## 2023-11-04 VITALS — BP 134/69 | HR 86 | Ht 64.0 in | Wt 202.0 lb

## 2023-11-04 DIAGNOSIS — Z6835 Body mass index (BMI) 35.0-35.9, adult: Secondary | ICD-10-CM

## 2023-11-04 DIAGNOSIS — F419 Anxiety disorder, unspecified: Secondary | ICD-10-CM

## 2023-11-04 DIAGNOSIS — F410 Panic disorder [episodic paroxysmal anxiety] without agoraphobia: Secondary | ICD-10-CM | POA: Insufficient documentation

## 2023-11-04 DIAGNOSIS — F50811 Binge eating disorder, moderate: Secondary | ICD-10-CM

## 2023-11-04 DIAGNOSIS — Z113 Encounter for screening for infections with a predominantly sexual mode of transmission: Secondary | ICD-10-CM

## 2023-11-04 DIAGNOSIS — F3341 Major depressive disorder, recurrent, in partial remission: Secondary | ICD-10-CM

## 2023-11-04 DIAGNOSIS — E66812 Obesity, class 2: Secondary | ICD-10-CM

## 2023-11-04 DIAGNOSIS — Z630 Problems in relationship with spouse or partner: Secondary | ICD-10-CM | POA: Insufficient documentation

## 2023-11-04 DIAGNOSIS — E6609 Other obesity due to excess calories: Secondary | ICD-10-CM

## 2023-11-04 MED ORDER — LISDEXAMFETAMINE DIMESYLATE 30 MG PO CAPS: 30.0000 mg | ORAL_CAPSULE | Freq: Every day | ORAL | 0 refills | Status: DC

## 2023-11-04 MED ORDER — BUPROPION HCL ER (XL) 300 MG PO TB24: 300.0000 mg | ORAL_TABLET | Freq: Every day | ORAL | 1 refills | Status: DC

## 2023-11-04 MED ORDER — SERTRALINE HCL 100 MG PO TABS: ORAL_TABLET | ORAL | 0 refills | Status: DC

## 2023-11-04 NOTE — Patient Instructions (Addendum)
 Increase zoloft  to 1 and 1/2 tablet daily  Valium  as needed Continue in therapy

## 2023-11-04 NOTE — Progress Notes (Signed)
 Established Patient Office Visit  Subjective   Patient ID: Michelle Berger, female    DOB: August 24, 1986  Age: 38 y.o. MRN: [id number]  Chief Complaint  Patient presents with   Medical Management of Chronic Issues    Mood sti testing     HPI  38 yo female presenting to clinic today to discuss anxiety medication options. Pt states that she found out her husband was cheating on her last Monday and since then she has been experiencing panic attacks every other day. She states that she will be at work and feel fine and then suddenly her HR will increase and she will feel uneasy. She currently has a prescription for Valium  and states she has had to take it every other day because of her anxiety. She wants to know if this is acceptable or if she needs to increase her other daily anxiety medications.   States her sleeping is currently fine. She was previously prescribed trazodone  for sleep but has not taken it in years. Currently taking melatonin at night and it is helping her sleep.  She would also like to have STI testing done today.  Review of Systems  All other systems reviewed and are negative.  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   Class 2 obesity due to excess calories without serious comorbidity with body mass index (BMI) of 35.0 to 35.9 in adult 07/12/2013   Preventive measure 07/12/2013   Seasonal and perennial allergic rhinitis 07/12/2013   History of migraine headaches 01/04/2015   Tendinitis, de Quervain's 01/04/2015   PMDD (premenstrual dysphoric disorder) 12/04/2016   Sebaceous cyst 01/28/2017   Inattention 01/28/2017   No energy 01/28/2017   Recurrent candidiasis of vagina 05/05/2017   Abnormal weight gain 05/05/2017   Foreign body in auricle of right ear 05/05/2017   Motion sickness 11/18/2017   Plantar fasciitis, right 01/26/2018   Migraines 05/19/2016   History of recurrent pneumonia 11/05/2018   Anxiety  12/22/2018   Marijuana use, episodic 12/22/2018   Cough 12/22/2018   Insomnia due to other mental disorder 05/04/2020   Childhood asthma 09/11/2020   Moderate persistent asthma 09/11/2020   Allergies 11/02/2020   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   Moderate persistent asthma with exacerbation 11/08/2021   Upper respiratory tract infection 11/08/2021   Binge eating disorder 10/07/2022   Poor concentration 10/07/2022   Partner relationship problems 11/04/2023   Panic attacks 11/04/2023   Resolved Ambulatory Problems    Diagnosis Date Noted   Acute maxillary sinusitis 11/17/2013   Reactive 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 asthma 12/22/2018   Past Medical History:  Diagnosis Date   Asthma    Depression    Headache    Recurrent upper respiratory infection (URI)       Objective:     BP 134/69   Pulse 86   Ht 5\' 4"  (1.626 m)   Wt 202 lb (91.6 kg)   SpO2 99%   BMI 34.67 kg/m   BP Readings from Last 3 Encounters:  11/04/23 134/69  02/02/23 135/73  11/14/22 115/67   Wt Readings from Last 3 Encounters:  11/04/23 202 lb (91.6 kg)  02/02/23 209 lb (94.8 kg)  11/14/22 192 lb (87.1 kg)       11/04/2023   10:41 AM 02/02/2023   11:37 AM 10/07/2022    8:51 AM  Depression screen PHQ 2/9  Decreased Interest 2 0 1  Down, Depressed, Hopeless 2 0 0  PHQ -  2 Score 4 0 1  Altered sleeping 1 0 0  Tired, decreased energy 0 2 1  Change in appetite 2 0 0  Feeling bad or failure about yourself  1 0 0  Trouble concentrating 1 0 2  Moving slowly or fidgety/restless 0 0 1  Suicidal thoughts 0 0 0  PHQ-9 Score 9 2 5   Difficult doing work/chores Not difficult at all Not difficult at all       11/04/2023   10:42 AM 02/02/2023   11:38 AM 10/07/2022    8:52 AM 07/02/2021   11:26 AM  GAD 7 : Generalized Anxiety Score  Nervous, Anxious, on Edge 3 0 0 3  Control/stop worrying 3 0 0 1  Worry too much - different things 3 0 0 2  Trouble relaxing 3 0 0 1  Restless 2 0 0 0  Easily annoyed or  irritable 2 0 1 2  Afraid - awful might happen 3 0 0 0  Total GAD 7 Score 19 0 1 9  Anxiety Difficulty Not difficult at all Not difficult at all Not difficult at all Somewhat difficult    Physical Exam Constitutional:      Appearance: Normal appearance.  HENT:     Head: Normocephalic.  Cardiovascular:     Rate and Rhythm: Normal rate and regular rhythm.     Pulses: Normal pulses.     Heart sounds: Normal heart sounds.  Pulmonary:     Effort: Pulmonary effort is normal.     Breath sounds: Normal breath sounds.  Neurological:     General: No focal deficit present.     Mental Status: She is alert and oriented to person, place, and time.  Psychiatric:        Mood and Affect: Mood normal.        Behavior: Behavior normal.       Assessment & Plan:   Michelle Berger was seen today for medical management of chronic issues.  Diagnoses and all orders for this visit:  Partner relationship problems  Recurrent major depressive disorder, in partial remission (HCC) -     buPROPion  (WELLBUTRIN  XL) 300 MG 24 hr tablet; Take 1 tablet (300 mg total) by mouth daily. -     sertraline  (ZOLOFT ) 100 MG tablet; Take one and one-half tablet daily.  Class 2 obesity due to excess calories without serious comorbidity with body mass index (BMI) of 35.0 to 35.9 in adult -     buPROPion  (WELLBUTRIN  XL) 300 MG 24 hr tablet; Take 1 tablet (300 mg total) by mouth daily.  Moderate binge-eating disorder -     lisdexamfetamine (VYVANSE ) 30 MG capsule; Take 1 capsule (30 mg total) by mouth daily. -     lisdexamfetamine (VYVANSE ) 30 MG capsule; Take 1 capsule (30 mg total) by mouth daily. -     lisdexamfetamine (VYVANSE ) 30 MG capsule; Take 1 capsule (30 mg total) by mouth daily.  Screening examination for STD (sexually transmitted disease) -     RPR -     HIV antibody (with reflex) -     HSV-2 Ab, IgG -     CMP14+EGFR -     Cancel: GC Probe amplification, urine -     NuSwab BV and Candida, NAA -      GC/Chlamydia Probe Amp([facility]) -     Chlamydia/GC NAA, Confirmation  Panic attacks  Anxiety  PHQ/GAD not to goal Increase Sertraline  to 150mg  for anxiety Valium  as needed for panic attacks Discussed to  use sparingly as to avoid dependence  STI testing completed  Vyvanse  refill sent to pharmacy No concerns  Follow up in 6 weeks  Sandy Crumb, PA-C

## 2023-11-05 LAB — SYPHILIS: RPR W/REFLEX TO RPR TITER AND TREPONEMAL ANTIBODIES, TRADITIONAL SCREENING AND DIAGNOSIS ALGORITHM: RPR Ser Ql: NONREACTIVE

## 2023-11-05 LAB — CMP14+EGFR
ALT: 16 [IU]/L (ref 0–32)
AST: 20 [IU]/L (ref 0–40)
Albumin: 4.6 g/dL (ref 3.9–4.9)
Alkaline Phosphatase: 86 [IU]/L (ref 44–121)
BUN/Creatinine Ratio: 10 (ref 9–23)
BUN: 9 mg/dL (ref 6–20)
Bilirubin Total: 0.3 mg/dL (ref 0.0–1.2)
CO2: 23 mmol/L (ref 20–29)
Calcium: 9.3 mg/dL (ref 8.7–10.2)
Chloride: 103 mmol/L (ref 96–106)
Creatinine, Ser: 0.89 mg/dL (ref 0.57–1.00)
Globulin, Total: 2.3 g/dL (ref 1.5–4.5)
Glucose: 72 mg/dL (ref 70–99)
Potassium: 4.7 mmol/L (ref 3.5–5.2)
Sodium: 141 mmol/L (ref 134–144)
Total Protein: 6.9 g/dL (ref 6.0–8.5)
eGFR: 85 mL/min/{1.73_m2} (ref >59)

## 2023-11-05 LAB — HSV-2 AB, IGG: HSV 2 IgG, Type Spec: NONREACTIVE

## 2023-11-05 LAB — HIV ANTIBODY (ROUTINE TESTING W REFLEX): HIV Screen 4th Generation wRfx: NONREACTIVE

## 2023-11-05 LAB — SPECIMEN STATUS REPORT

## 2023-11-06 NOTE — Progress Notes (Signed)
Negative for yeast, BV, Trichomonas.   Kidney, liver, glucose look great.   Other STD pending.

## 2023-11-09 ENCOUNTER — Encounter: Payer: Self-pay | Admitting: Physician Assistant

## 2023-11-09 NOTE — Progress Notes (Signed)
Negative chlamydia and gonorrhea as well. GREAT news.

## 2023-11-10 LAB — WET PREP, GENITAL
Clue Cell Exam: NEGATIVE
Trichomonas Exam: NEGATIVE
Yeast Exam: NEGATIVE

## 2023-11-10 LAB — GC/CHLAMYDIA PROBE AMP
Chlamydia trachomatis, NAA: NEGATIVE
Neisseria Gonorrhoeae by PCR: NEGATIVE

## 2023-11-10 LAB — CHLAMYDIA/GC NAA, CONFIRMATION

## 2023-11-10 LAB — TRICHOMONAS CULTURE

## 2023-11-10 LAB — NUSWAB BV AND CANDIDA, NAA

## 2023-12-03 ENCOUNTER — Telehealth: Payer: Self-pay | Admitting: Family Medicine

## 2023-12-03 DIAGNOSIS — J4541 Moderate persistent asthma with (acute) exacerbation: Secondary | ICD-10-CM

## 2023-12-03 DIAGNOSIS — J069 Acute upper respiratory infection, unspecified: Secondary | ICD-10-CM

## 2023-12-03 MED ORDER — PROMETHAZINE-DM 6.25-15 MG/5ML PO SYRP: 5.0000 mL | ORAL_SOLUTION | Freq: Three times a day (TID) | ORAL | 0 refills | Status: DC | PRN

## 2023-12-03 MED ORDER — ALBUTEROL SULFATE HFA 108 (90 BASE) MCG/ACT IN AERS: 1.0000 | INHALATION_SPRAY | RESPIRATORY_TRACT | 1 refills | Status: DC | PRN

## 2023-12-03 MED ORDER — BENZONATATE 100 MG PO CAPS: 100.0000 mg | ORAL_CAPSULE | Freq: Three times a day (TID) | ORAL | 0 refills | Status: DC | PRN

## 2023-12-03 NOTE — Progress Notes (Signed)
E-Visit for Cough   We are sorry that you are not feeling well.  Here is how we plan to help!  Based on your presentation I believe you most likely have A cough due to a virus.  This is called viral bronchitis and is best treated by rest, plenty of fluids and control of the cough.  You may use Ibuprofen or Tylenol as directed to help your symptoms.     In addition you may use A prescription cough medication called Tessalon Perles 100mg . You may take 1-2 capsules every 8 hours as needed for your cough.  Promethazine DM cough syrup  From your responses in the eVisit questionnaire you describe inflammation in the upper respiratory tract which is causing a significant cough.  This is commonly called Bronchitis and has four common causes:   Allergies Viral Infections Acid Reflux Bacterial Infection Allergies, viruses and acid reflux are treated by controlling symptoms or eliminating the cause. An example might be a cough caused by taking certain blood pressure medications. You stop the cough by changing the medication. Another example might be a cough caused by acid reflux. Controlling the reflux helps control the cough.  USE OF BRONCHODILATOR ("RESCUE") INHALERS: There is a risk from using your bronchodilator too frequently.  The risk is that over-reliance on a medication which only relaxes the muscles surrounding the breathing tubes can reduce the effectiveness of medications prescribed to reduce swelling and congestion of the tubes themselves.  Although you feel brief relief from the bronchodilator inhaler, your asthma may actually be worsening with the tubes becoming more swollen and filled with mucus.  This can delay other crucial treatments, such as oral steroid medications. If you need to use a bronchodilator inhaler daily, several times per day, you should discuss this with your provider.  There are probably better treatments that could be used to keep your asthma under control.     HOME  CARE Only take medications as instructed by your medical team. Complete the entire course of an antibiotic. Drink plenty of fluids and get plenty of rest. Avoid close contacts especially the very young and the elderly Cover your mouth if you cough or cough into your sleeve. Always remember to wash your hands A steam or ultrasonic humidifier can help congestion.   GET HELP RIGHT AWAY IF: You develop worsening fever. You become short of breath You cough up blood. Your symptoms persist after you have completed your treatment plan MAKE SURE YOU  Understand these instructions. Will watch your condition. Will get help right away if you are not doing well or get worse.    Thank you for choosing an e-visit.  Your e-visit answers were reviewed by a board certified advanced clinical practitioner to complete your personal care plan. Depending upon the condition, your plan could have included both over the counter or prescription medications.  Please review your pharmacy choice. Make sure the pharmacy is open so you can pick up prescription now. If there is a problem, you may contact your provider through [employer] and have the prescription routed to another pharmacy.  Your safety is important to [place]. If you have drug allergies check your prescription carefully.   For the next 24 hours you can use MyChart to ask questions about today's visit, request a non-urgent call back, or ask for a work or school excuse. You will get an email in the next two days asking about your experience. I hope that your e-visit has been valuable and  will speed your recovery.  I provided 5 minutes of non face-to-face time during this encounter for chart review, medication and order placement, as well as and documentation.

## 2023-12-04 ENCOUNTER — Encounter: Payer: Self-pay | Admitting: Physician Assistant

## 2023-12-04 DIAGNOSIS — U071 COVID-19: Secondary | ICD-10-CM

## 2023-12-04 MED ORDER — NIRMATRELVIR/RITONAVIR (PAXLOVID)TABLET: 3.0000 | ORAL_TABLET | Freq: Two times a day (BID) | ORAL | 0 refills | Status: AC

## 2023-12-28 DIAGNOSIS — F4323 Adjustment disorder with mixed anxiety and depressed mood: Secondary | ICD-10-CM | POA: Diagnosis not present

## 2024-01-04 ENCOUNTER — Ambulatory Visit: Payer: Self-pay | Admitting: Physician Assistant

## 2024-01-04 ENCOUNTER — Encounter: Payer: Self-pay | Admitting: Physician Assistant

## 2024-01-04 DIAGNOSIS — J09X2 Influenza due to identified novel influenza A virus with other respiratory manifestations: Secondary | ICD-10-CM | POA: Diagnosis not present

## 2024-01-04 DIAGNOSIS — J069 Acute upper respiratory infection, unspecified: Secondary | ICD-10-CM

## 2024-01-04 NOTE — Telephone Encounter (Signed)
  Chief Complaint: congestion, fever Symptoms: Productive cough, chest congestion, fever, mild shortness of breath Frequency: Ongoing since Friday Pertinent Negatives: Patient denies chest pain, hemoptysis Disposition: [] ED /[x] Urgent Care (no appt availability in office) / [] Appointment(In office/virtual)/ []  [facility]/ [] Home Care/ [] Refused Recommended Disposition /[] [facility]/ []  Follow-up with PCP Additional Notes:  Cough started on Friday. Productive of green mucous. Mild shortness of breath with ambulation. Fever since Friday with T max 102.0, OTC antipyretics reduces fever but rebounds to 102.0 before next dose is due.  Using Theraflu and motrin with fair effect. History of asthma. Feels like when she had pneumonia in the past. Urgent care advised, she is out of state in [place] and will attend local walk in urgent care today.    Message from Hope S sent at 01/04/2024 11:30 AM EDT  Summary: fever (102), chest congestion   Copied From CRM [phone]. Reason for Triage: chest congestion, fever (102)      Reason for Disposition  [1] MILD difficulty breathing (e.g., minimal/no SOB at rest, SOB with walking, pulse <100) AND [2] still present when not coughing  Protocols used: Cough - Acute Productive-A-AH

## 2024-01-04 NOTE — Telephone Encounter (Signed)
 Attempted call to patient to assist in scheduling an appt for symptoms evaluation. Left a voice mail message requesting a return call.

## 2024-01-05 MED ORDER — PROMETHAZINE-DM 6.25-15 MG/5ML PO SYRP: 5.0000 mL | ORAL_SOLUTION | Freq: Three times a day (TID) | ORAL | 0 refills | Status: DC | PRN

## 2024-01-05 MED ORDER — OSELTAMIVIR PHOSPHATE 75 MG PO CAPS: 75.0000 mg | ORAL_CAPSULE | Freq: Two times a day (BID) | ORAL | 0 refills | Status: DC

## 2024-01-18 ENCOUNTER — Other Ambulatory Visit: Payer: Self-pay | Admitting: Physician Assistant

## 2024-01-18 DIAGNOSIS — F50811 Binge eating disorder, moderate: Secondary | ICD-10-CM

## 2024-01-21 DIAGNOSIS — F4323 Adjustment disorder with mixed anxiety and depressed mood: Secondary | ICD-10-CM | POA: Diagnosis not present

## 2024-01-26 ENCOUNTER — Encounter: Payer: Self-pay | Admitting: Physician Assistant

## 2024-02-22 DIAGNOSIS — F4323 Adjustment disorder with mixed anxiety and depressed mood: Secondary | ICD-10-CM | POA: Diagnosis not present

## 2024-03-03 ENCOUNTER — Encounter: Payer: Self-pay | Admitting: Physician Assistant

## 2024-03-03 DIAGNOSIS — F50811 Binge eating disorder, moderate: Secondary | ICD-10-CM

## 2024-03-04 MED ORDER — LISDEXAMFETAMINE DIMESYLATE 30 MG PO CAPS: 30.0000 mg | ORAL_CAPSULE | Freq: Every day | ORAL | 0 refills | Status: DC

## 2024-03-17 DIAGNOSIS — F4323 Adjustment disorder with mixed anxiety and depressed mood: Secondary | ICD-10-CM | POA: Diagnosis not present

## 2024-03-29 DIAGNOSIS — F4323 Adjustment disorder with mixed anxiety and depressed mood: Secondary | ICD-10-CM | POA: Diagnosis not present

## 2024-04-13 ENCOUNTER — Encounter: Payer: Self-pay | Admitting: Physician Assistant

## 2024-04-13 DIAGNOSIS — F50811 Binge eating disorder, moderate: Secondary | ICD-10-CM

## 2024-04-13 DIAGNOSIS — F50819 Binge eating disorder, unspecified: Secondary | ICD-10-CM

## 2024-04-13 MED ORDER — FLUCONAZOLE 150 MG PO TABS: 150.0000 mg | ORAL_TABLET | Freq: Once | ORAL | 0 refills | Status: AC

## 2024-04-13 MED ORDER — LISDEXAMFETAMINE DIMESYLATE 30 MG PO CAPS
30.0000 mg | ORAL_CAPSULE | Freq: Every day | ORAL | 0 refills | Status: DC
Start: 2024-04-13 — End: 2024-06-01

## 2024-04-13 NOTE — Telephone Encounter (Signed)
 Requesting rx rf of Vyvanse  30mg   To [facility] as pended Last written 03/04/2024 Last OV 01/152025 Upcoming appt = none Patient also requesting medication for yeast infection - please see patient note attached to this message.

## 2024-04-14 DIAGNOSIS — F4323 Adjustment disorder with mixed anxiety and depressed mood: Secondary | ICD-10-CM | POA: Diagnosis not present

## 2024-04-18 MED ORDER — LISDEXAMFETAMINE DIMESYLATE 30 MG PO CAPS: 30.0000 mg | ORAL_CAPSULE | Freq: Every day | ORAL | 0 refills | Status: DC

## 2024-04-18 NOTE — Addendum Note (Signed)
 Addended by: Admiral Marcucci P on: 04/18/2024 09:47 AM   Modules accepted: Orders

## 2024-04-18 NOTE — Telephone Encounter (Signed)
 Spoke with [facility] pharmacy - cancelled script sent to them on 04/13/2024

## 2024-04-18 NOTE — Telephone Encounter (Signed)
 Forwarding message to Dr. Alvan covering Michelle Berger Patient requesting to move vyvanse  - Last sent on 0625/2025 ro [facility] pharmacy- Requesting to send prescription to [facility] instead Pended prescription

## 2024-05-12 DIAGNOSIS — F4323 Adjustment disorder with mixed anxiety and depressed mood: Secondary | ICD-10-CM | POA: Diagnosis not present

## 2024-05-27 ENCOUNTER — Other Ambulatory Visit: Payer: Self-pay

## 2024-05-27 ENCOUNTER — Ambulatory Visit
Admission: RE | Admit: 2024-05-27 | Discharge: 2024-05-27 | Disposition: A | Discharge status: Home / Self Care | Payer: Self-pay | Source: Ambulatory Visit | Attending: Physician Assistant | Admitting: Physician Assistant

## 2024-05-27 VITALS — BP 116/77 | HR 88 | Temp 98.4°F | Resp 16 | Ht 64.0 in | Wt 198.0 lb

## 2024-05-27 DIAGNOSIS — R300 Dysuria: Secondary | ICD-10-CM | POA: Insufficient documentation

## 2024-05-27 DIAGNOSIS — R3911 Hesitancy of micturition: Secondary | ICD-10-CM | POA: Insufficient documentation

## 2024-05-27 DIAGNOSIS — R21 Rash and other nonspecific skin eruption: Secondary | ICD-10-CM | POA: Insufficient documentation

## 2024-05-27 LAB — POCT URINE DIPSTICK
Bilirubin, UA: NEGATIVE
Blood, UA: NEGATIVE
Glucose, UA: NEGATIVE mg/dL
Ketones, POC UA: NEGATIVE mg/dL
Nitrite, UA: NEGATIVE
POC PROTEIN,UA: NEGATIVE
Spec Grav, UA: 1.02 (ref 1.010–1.025)
Urobilinogen, UA: 1 U/dL
pH, UA: 7 (ref 5.0–8.0)

## 2024-05-27 NOTE — Discharge Instructions (Signed)
 You were seen today for concerns of increased urinary frequency as well as mild urinary hesitancy.  Your urine had trace amounts of leukocytes, also known as white blood cells, but no other signs indicative of a UTI.  We have sent a urine culture off for definitive rule out.  We will keep you updated on those results once they are available. I am concerned that the rash on your thigh could be HSV.  We have completed a swab to assess for this.  The results should be back in 1 to 3 days and we will keep you updated on those results.  Since you have had this rash for longer than 72 hours it is unlikely that you will benefit from an antiviral medication. Please prevent contact with the rash and others.  You can keep it covered during the day with an adhesive bandage and leave it open at night if preferred.  Please make sure that you are washing your clothing and bed linens to help prevent further spread.  If this is in fact HSV or shingles it is contagious until the rash crusts over.

## 2024-05-27 NOTE — ED Provider Notes (Signed)
 [facility]    CSN: [id number] Arrival date & time: 05/27/24  1607      History   Chief Complaint Chief Complaint  Patient presents with   Rash    Entered by patient  Noticed about six days ago.    Urinary Urgency    X 1 month.     HPI Michelle Berger is a 38 y.o. female.   HPI  Pt reports concerns for a rash and concerns for UTI  She states she is feeling like she needs to urinate more frequently and there is some urinary hesitancy  She states she was seen at another provider  office about 3 weeks ago and was treated for UTI and is concerned this may not have been completely eradicated or that it may have come back   She reports increased amount of vaginal discharge but denies changes in consistency or color  She denies vaginal bleeding and states she has had some sharp pains that shoot through her vagina and rectal area   She states the rash is located on the inside of her thigh  She reports it mildly stings She states she was in [place]  prior to apt and is unsure if this is from chaffing or HSV.  She states this has been present for almost a week  She states it has been draining clear fluid   She is unsure if she has had chickenpox as she was adopted at age 41  She is concerned this may be shingles     Past Medical History:  Diagnosis Date   Asthma    Depression    Headache    Recurrent upper respiratory infection (URI)     Patient Active Problem List   Diagnosis Date Noted   Partner relationship problems 11/04/2023   Panic attacks 11/04/2023   Binge eating disorder 10/07/2022   Poor concentration 10/07/2022   Moderate persistent asthma with exacerbation 11/08/2021   Upper respiratory tract infection 11/08/2021   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   Allergies 11/02/2020   Childhood asthma 09/11/2020   Moderate persistent asthma 09/11/2020   Insomnia due to other mental disorder 05/04/2020   Anxiety 12/22/2018   Marijuana use,  episodic 12/22/2018   Cough 12/22/2018   History of recurrent pneumonia 11/05/2018   Plantar fasciitis, right 01/26/2018   Motion sickness 11/18/2017   Recurrent candidiasis of vagina 05/05/2017   Abnormal weight gain 05/05/2017   Foreign body in auricle of right ear 05/05/2017   Sebaceous cyst 01/28/2017   Inattention 01/28/2017   No energy 01/28/2017   PMDD (premenstrual dysphoric disorder) 12/04/2016   Migraines 05/19/2016   History of migraine headaches 01/04/2015   Tendinitis, de Quervain's 01/04/2015   Major depression 07/12/2013   Class 2 obesity due to excess calories without serious comorbidity with body mass index (BMI) of 35.0 to 35.9 in adult 07/12/2013   Preventive measure 07/12/2013   Seasonal and perennial allergic rhinitis 07/12/2013    Past Surgical History:  Procedure Laterality Date   LIPOSUCTION MULTIPLE BODY PARTS N/A    abdomen and back   tummy tuck      OB History   No obstetric history on file.      Home Medications    Prior to Admission medications   Medication Sig Start Date End Date Taking? Authorizing Provider  albuterol  (VENTOLIN  HFA) 108 (90 Base) MCG/ACT inhaler Inhale 1-2 puffs into the lungs every 4 (four) hours as needed for wheezing or shortness of breath. 12/03/23  Moishe Chiquita HERO, NP  benzonatate  (TESSALON ) 100 MG capsule Take 1 capsule (100 mg total) by mouth 3 (three) times daily as needed for cough. 12/03/23   Moishe Chiquita HERO, NP  budesonide -formoterol  (SYMBICORT ) 160-4.5 MCG/ACT inhaler Inhale 2 puffs into the lungs 2 (two) times daily. 11/14/22   Breeback, Jade L, PA-C  buPROPion  (WELLBUTRIN  XL) 300 MG 24 hr tablet Take 1 tablet (300 mg total) by mouth daily. 11/04/23   Breeback, Jade L, PA-C  diazepam  (VALIUM ) 5 MG tablet Take 1 tablet (5 mg total) by mouth every 12 (twelve) hours as needed for anxiety. 10/02/23   Breeback, Jade L, PA-C  lisdexamfetamine (VYVANSE ) 30 MG capsule Take 1 capsule (30 mg total) by mouth daily. 11/04/23    Breeback, Jade L, PA-C  lisdexamfetamine (VYVANSE ) 30 MG capsule Take 1 capsule (30 mg total) by mouth daily. 12/04/23   Breeback, Jade L, PA-C  lisdexamfetamine (VYVANSE ) 30 MG capsule Take 1 capsule (30 mg total) by mouth daily. 04/13/24   Breeback, Jade L, PA-C  lisdexamfetamine (VYVANSE ) 30 MG capsule Take 1 capsule (30 mg total) by mouth daily. 04/18/24   Alvan Dorothyann BIRCH, MD  montelukast  (SINGULAIR ) 10 MG tablet Take 1 tablet (10 mg total) by mouth at bedtime. 09/16/23   Breeback, Jade L, PA-C  oseltamivir  (TAMIFLU ) 75 MG capsule Take 1 capsule (75 mg total) by mouth 2 (two) times daily. 01/05/24   Breeback, Jade L, PA-C  promethazine -dextromethorphan (PROMETHAZINE -DM) 6.25-15 MG/5ML syrup Take 5 mLs by mouth 3 (three) times daily as needed for cough. 01/05/24   Breeback, Jade L, PA-C  Semaglutide -Weight Management (WEGOVY ) 1 MG/0.5ML SOAJ Inject 1 mg into the skin once a week. Needs appointment. 05/25/23   Breeback, Jade L, PA-C  sertraline  (ZOLOFT ) 100 MG tablet Take one and one-half tablet daily. Patient taking differently: 150 mg. Take one and one-half tablet daily. 11/04/23   Breeback, Jade L, PA-C  WEGOVY  1 MG/0.5ML SOAJ Inject 1 mg into the skin once a week. Needs appointment. 05/25/23   Antoniette Vermell CROME, PA-C    Family History Family History  Adopted: Yes  Problem Relation Age of Onset   Allergic rhinitis Neg Hx    Angioedema Neg Hx    Asthma Neg Hx    Atopy Neg Hx    Eczema Neg Hx    Immunodeficiency Neg Hx    Urticaria Neg Hx     Social History Social History   Tobacco Use   Smoking status: Never   Smokeless tobacco: Never  Vaping Use   Vaping status: Never Used  Substance Use Topics   Alcohol use: Yes    Alcohol/week: 6.0 standard drinks of alcohol    Types: 6 Glasses of wine per week   Drug use: Yes    Types: Marijuana    Comment: Jule or vape pen     Allergies   Sulfa antibiotics   Review of Systems Review of Systems  Constitutional:  Negative for  chills and fever.  Gastrointestinal:  Negative for abdominal pain.  Genitourinary:  Positive for frequency and vaginal discharge. Negative for difficulty urinating, dysuria, flank pain, vaginal bleeding and vaginal pain.  Skin:  Positive for rash.     Physical Exam Triage Vital Signs ED Triage Vitals  Encounter Vitals Group     BP 05/27/24 1616 116/77     Girls Systolic BP Percentile --      Girls Diastolic BP Percentile --      Boys Systolic BP Percentile --  Boys Diastolic BP Percentile --      Pulse Rate 05/27/24 1616 88     Resp 05/27/24 1616 16     Temp 05/27/24 1616 98.4 F (36.9 C)     Temp Source 05/27/24 1616 Oral     SpO2 05/27/24 1616 98 %     Weight 05/27/24 1616 198 lb (89.8 kg)     Height 05/27/24 1616 5' 4 (1.626 m)     Head Circumference --      Peak Flow --      Pain Score 05/27/24 1646 0     Pain Loc --      Pain Education --      Exclude from Growth Chart --    No data found.  Updated Vital Signs BP 116/77 (BP Location: Right Arm)   Pulse 88   Temp 98.4 F (36.9 C) (Oral)   Resp 16   Ht 5' 4 (1.626 m)   Wt 198 lb (89.8 kg)   LMP 05/16/2024 (Approximate)   SpO2 98%   BMI 33.99 kg/m   Visual Acuity Right Eye Distance:   Left Eye Distance:   Bilateral Distance:    Right Eye Near:   Left Eye Near:    Bilateral Near:     Physical Exam Vitals reviewed.  Constitutional:      General: She is awake.     Appearance: Normal appearance. She is well-developed and well-groomed.  HENT:     Head: Normocephalic and atraumatic.  Eyes:     General: Lids are normal. Gaze aligned appropriately.     Extraocular Movements: Extraocular movements intact.     Conjunctiva/sclera: Conjunctivae normal.  Pulmonary:     Effort: Pulmonary effort is normal.  Skin:    General: Skin is warm and dry.     Findings: Rash present. Rash is papular and vesicular.      Neurological:     Mental Status: She is alert and oriented to person, place, and time.   Psychiatric:        Attention and Perception: Attention and perception normal.        Mood and Affect: Mood and affect normal.        Speech: Speech normal.        Behavior: Behavior normal. Behavior is cooperative.      UC Treatments / Results  Labs (all labs ordered are listed, but only abnormal results are displayed) Labs Reviewed  POCT URINE DIPSTICK - Abnormal; Notable for the following components:      Result Value   Clarity, UA cloudy (*)    Leukocytes, UA Trace (*)    All other components within normal limits  URINE CULTURE  HSV 1/2 PCR (SURFACE)    EKG   Radiology No results found.  Procedures Procedures (including critical care time)  Medications Ordered in UC Medications - No data to display  Initial Impression / Assessment and Plan / UC Course  I have reviewed the triage vital signs and the nursing notes.  Pertinent labs & imaging results that were available during my care of the patient were reviewed by me and considered in my medical decision making (see chart for details).      Final Clinical Impressions(s) / UC Diagnoses   Final diagnoses:  Dysuria  Urinary hesitancy  Papulovesicular rash   Patient presents today with concerns for urinary hesitancy has been ongoing for about a month as well as increased urinary frequency.  Urine dip was notable for  trace leukocytes.  Based on symptoms I am unsure if she has a UTI so we will send urine culture off for definitive rule out.  Patient reports that she has been having recurrent yeast infections but declines cervicovaginal swab today.  She does report increased volume of vaginal discharge but denies changes to consistency or color.  This could be source of pyuria.  Patient also has concerns for approximately 3 cm diameter papulovesicular rash on the inside of her right thigh that is been present for several days.  Rash does appear consistent with potential HSV but I cannot definitively rule out shingles  either.  Will swab for HSV today.  Since symptoms and rash have been present for longer than 72 hours she is outside the recommended therapeutic window for antiviral medications.  Reviewed home measures to help prevent further spread or transmission to others until rash has resolved.  Follow-up as needed for progressing or persistent symptoms    Discharge Instructions      You were seen today for concerns of increased urinary frequency as well as mild urinary hesitancy.  Your urine had trace amounts of leukocytes, also known as white blood cells, but no other signs indicative of a UTI.  We have sent a urine culture off for definitive rule out.  We will keep you updated on those results once they are available. I am concerned that the rash on your thigh could be HSV.  We have completed a swab to assess for this.  The results should be back in 1 to 3 days and we will keep you updated on those results.  Since you have had this rash for longer than 72 hours it is unlikely that you will benefit from an antiviral medication. Please prevent contact with the rash and others.  You can keep it covered during the day with an adhesive bandage and leave it open at night if preferred.  Please make sure that you are washing your clothing and bed linens to help prevent further spread.  If this is in fact HSV or shingles it is contagious until the rash crusts over.     ED Prescriptions   None    PDMP not reviewed this encounter.   Meldon Hanzlik E, PA-C 05/27/24 1904

## 2024-05-27 NOTE — ED Triage Notes (Addendum)
 Pt presents with complaints of rash and urinary urgency. Noticed rash about six days ago. Located on the right inner thigh area. Pt is concerned that it may be herpes. Currently denies pain. No drainage noted by patient from the area. Recently went out of town, was walking a good amount in hot weather. Believes she irritated the area more.   Also is complaining of urinary urgency x 1 month. Denies pain with urination. Would like to rule out UTI.

## 2024-05-28 LAB — HSV 1/2 PCR (SURFACE)
HSV-1 DNA: NOT DETECTED
HSV-2 DNA: DETECTED — AB

## 2024-05-29 LAB — URINE CULTURE

## 2024-05-30 ENCOUNTER — Ambulatory Visit (HOSPITAL_COMMUNITY): Payer: Self-pay

## 2024-05-30 DIAGNOSIS — F4323 Adjustment disorder with mixed anxiety and depressed mood: Secondary | ICD-10-CM | POA: Diagnosis not present

## 2024-05-30 MED ORDER — ACYCLOVIR 400 MG PO TABS: 400.0000 mg | ORAL_TABLET | Freq: Three times a day (TID) | ORAL | 0 refills | Status: DC

## 2024-06-01 ENCOUNTER — Ambulatory Visit (INDEPENDENT_AMBULATORY_CARE_PROVIDER_SITE_OTHER): Payer: Self-pay | Admitting: Physician Assistant

## 2024-06-01 ENCOUNTER — Encounter: Payer: Self-pay | Admitting: Physician Assistant

## 2024-06-01 ENCOUNTER — Ambulatory Visit: Payer: Self-pay | Admitting: Physician Assistant

## 2024-06-01 VITALS — BP 118/67 | HR 67 | Ht 64.0 in | Wt 194.0 lb

## 2024-06-01 DIAGNOSIS — F50811 Binge eating disorder, moderate: Secondary | ICD-10-CM

## 2024-06-01 DIAGNOSIS — Z630 Problems in relationship with spouse or partner: Secondary | ICD-10-CM

## 2024-06-01 DIAGNOSIS — F3341 Major depressive disorder, recurrent, in partial remission: Secondary | ICD-10-CM

## 2024-06-01 DIAGNOSIS — F419 Anxiety disorder, unspecified: Secondary | ICD-10-CM

## 2024-06-01 DIAGNOSIS — F410 Panic disorder [episodic paroxysmal anxiety] without agoraphobia: Secondary | ICD-10-CM

## 2024-06-01 DIAGNOSIS — B009 Herpesviral infection, unspecified: Secondary | ICD-10-CM | POA: Insufficient documentation

## 2024-06-01 MED ORDER — VALACYCLOVIR HCL 500 MG PO TABS: 500.0000 mg | ORAL_TABLET | Freq: Two times a day (BID) | ORAL | 0 refills | Status: DC

## 2024-06-01 MED ORDER — SERTRALINE HCL 100 MG PO TABS: ORAL_TABLET | ORAL | 0 refills | Status: DC

## 2024-06-01 MED ORDER — LISDEXAMFETAMINE DIMESYLATE 40 MG PO CAPS: 40.0000 mg | ORAL_CAPSULE | ORAL | 0 refills | Status: DC

## 2024-06-01 NOTE — Patient Instructions (Signed)
 Adjunct to zoloft : Vraylar or Rexulti or we could just add back wellbutrin   Increase zoloft  to 150mg  daily Increase vyvanse  to 40mg  daily  Replace acyclovir  with Valtrex 

## 2024-06-01 NOTE — Progress Notes (Signed)
 Established Patient Office Visit  Subjective   Patient ID: Michelle Berger, female    DOB: [birth year]  Age: 38 y.o. MRN: [id number]  Chief Complaint  Patient presents with   Medical Management of Chronic Issues    HPI Pt is a 38 yo female who presents to the clinic for medication refills and to follow up on mood.   She is currently separated from her husband of 18 years and going through a hard time. She was doing better but over the last 2 months has felt low. She is in therapy once a week. [place] She feels like she cannot be here because the whole town is talking. Her whole life has had to change. She denies any SI/HC. She has tapering off wellbutrin . She did not really feel any different once that happened. She did this about 2 months ago. She does admit slowly she has started to feel more depressed. She is not working and feels like she has no purpose.   She had to go to [facility] for a rash on her right inner thigh. 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 This has been hard for her. She has not started anti-viral yet. She is really concerned about future outbreaks.    ROS See HPI.    Objective:     BP 118/67   Pulse 67   Ht 5' 4 (1.626 m)   Wt 194 lb (88 kg)   LMP 05/16/2024 (Approximate)   SpO2 99%   BMI 33.30 kg/m  BP Readings from Last 3 Encounters:  06/01/24 118/67  05/27/24 116/77  11/04/23 134/69   Wt Readings from Last 3 Encounters:  06/01/24 194 lb (88 kg)  05/27/24 198 lb (89.8 kg)  11/04/23 202 lb (91.6 kg)    ..    06/01/2024    4:47 PM 11/04/2023   10:41 AM 02/02/2023   11:37 AM 10/07/2022    8:51 AM 03/24/2022    8:16 AM  Depression screen PHQ 2/9  Decreased Interest 2 2 0 1 0  Down, Depressed, Hopeless 2 2 0 0 0  PHQ - 2 Score 4 4 0 1 0  Altered sleeping 3 1 0 0   Tired, decreased energy 2 0 2 1   Change in appetite 0 2 0 0   Feeling bad or failure about yourself  0 1 0 0   Trouble concentrating 1  1 0 2   Moving slowly or fidgety/restless 0 0 0 1   Suicidal thoughts 0 0 0 0   PHQ-9 Score 10 9 2 5    Difficult doing work/chores Somewhat difficult Not difficult at all Not difficult at all     .Michelle Berger    06/01/2024    4:47 PM 11/04/2023   10:42 AM 02/02/2023   11:38 AM 10/07/2022    8:52 AM  GAD 7 : Generalized Anxiety Score  Nervous, Anxious, on Edge 3 3 0 0  Control/stop worrying 3 3 0 0  Worry too much - different things 3 3 0 0  Trouble relaxing 3 3 0 0  Restless 0 2 0 0  Easily annoyed or irritable 1 2 0 1  Afraid - awful might happen 3 3 0 0  Total GAD 7 Score 16 19 0 1  Anxiety Difficulty Somewhat difficult Not difficult at all Not difficult at all Not difficult at all      Physical Exam Constitutional:  Appearance: Normal appearance.  HENT:     Head: Normocephalic.  Cardiovascular:     Rate and Rhythm: Normal rate and regular rhythm.  Pulmonary:     Effort: Pulmonary effort is normal.     Breath sounds: Normal breath sounds.  Neurological:     General: No focal deficit present.     Mental Status: She is alert and oriented to person, place, and time.  Psychiatric:        Mood and Affect: Mood normal.        Assessment & Plan:  Michelle Berger was seen today for medical management of chronic issues.  Diagnoses and all orders for this visit:  HSV-2 infection -     valACYclovir  (VALTREX ) 500 MG tablet; Take 1 tablet (500 mg total) by mouth 2 (two) times daily for 3 days. Then take one tablet daily for prevention.  Partner relationship problems -     sertraline  (ZOLOFT ) 100 MG tablet; Take one and one-half tablet daily.  Recurrent major depressive disorder, in partial remission (HCC) -     sertraline  (ZOLOFT ) 100 MG tablet; Take one and one-half tablet daily.  Anxiety -     sertraline  (ZOLOFT ) 100 MG tablet; Take one and one-half tablet daily.  Panic attacks -     sertraline  (ZOLOFT ) 100 MG tablet; Take one and one-half tablet daily.  Moderate  binge-eating disorder -     lisdexamfetamine (VYVANSE ) 40 MG capsule; Take 1 capsule (40 mg total) by mouth every morning.   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   Spent 40 minutes with patient discussing concerns and then appropriate treatment plan.   Michelle Nies, PA-C

## 2024-06-03 ENCOUNTER — Encounter: Payer: Self-pay | Admitting: Physician Assistant

## 2024-06-09 DIAGNOSIS — F4323 Adjustment disorder with mixed anxiety and depressed mood: Secondary | ICD-10-CM | POA: Diagnosis not present

## 2024-06-23 DIAGNOSIS — F4323 Adjustment disorder with mixed anxiety and depressed mood: Secondary | ICD-10-CM | POA: Diagnosis not present

## 2024-06-29 DIAGNOSIS — N898 Other specified noninflammatory disorders of vagina: Secondary | ICD-10-CM | POA: Diagnosis not present

## 2024-06-29 DIAGNOSIS — F4323 Adjustment disorder with mixed anxiety and depressed mood: Secondary | ICD-10-CM | POA: Diagnosis not present

## 2024-07-11 ENCOUNTER — Encounter: Payer: Self-pay | Admitting: Obstetrics & Gynecology

## 2024-07-11 ENCOUNTER — Ambulatory Visit (INDEPENDENT_AMBULATORY_CARE_PROVIDER_SITE_OTHER): Payer: Self-pay | Admitting: Obstetrics & Gynecology

## 2024-07-11 ENCOUNTER — Other Ambulatory Visit (HOSPITAL_COMMUNITY)
Admission: RE | Admit: 2024-07-11 | Discharge: 2024-07-11 | Disposition: A | Discharge status: Home / Self Care | Source: Ambulatory Visit | Attending: Obstetrics & Gynecology | Admitting: Obstetrics & Gynecology

## 2024-07-11 VITALS — BP 124/74 | HR 89 | Ht 64.0 in | Wt 195.0 lb

## 2024-07-11 DIAGNOSIS — Z30011 Encounter for initial prescription of contraceptive pills: Secondary | ICD-10-CM

## 2024-07-11 DIAGNOSIS — Z1331 Encounter for screening for depression: Secondary | ICD-10-CM | POA: Diagnosis not present

## 2024-07-11 DIAGNOSIS — Z01419 Encounter for gynecological examination (general) (routine) without abnormal findings: Secondary | ICD-10-CM

## 2024-07-11 DIAGNOSIS — F3281 Premenstrual dysphoric disorder: Secondary | ICD-10-CM | POA: Diagnosis not present

## 2024-07-11 LAB — POCT URINALYSIS DIPSTICK: Glucose, UA: NEGATIVE

## 2024-07-11 MED ORDER — DROSPIRENONE-ETHINYL ESTRADIOL 3-0.02 MG PO TABS: 1.0000 | ORAL_TABLET | Freq: Every day | ORAL | 12 refills | Status: DC

## 2024-07-11 NOTE — Progress Notes (Signed)
 GYNECOLOGY CLINIC ANNUAL PREVENTATIVE CARE ENCOUNTER NOTE  Subjective:   Michelle Berger is a 38 y.o. G1P1001 female here for a routine annual gynecologic exam.  Current complaints: recent HSV outbreak with positive HSV 2 on 05/27/24.   Denies abnormal vaginal bleeding, discharge, pelvic pain, problems with intercourse or other gynecologic concerns.   Premenstrual tension and needs a [facility] Gynecologic History Patient's last menstrual period was 07/05/2024 (exact date). Contraception: none Last Pap: 2021. Results were: normal Last mammogram: no. Results were: n/a S/P mammoplasty and subsequent breast augmentation  Obstetric History OB History  Gravida Para Term Preterm AB Living  1 1 1   1   SAB IAB Ectopic Multiple Live Births      1    # Outcome Date GA Lbr Len/2nd Weight Sex Type Anes PTL Lv  1 Term 2005 [redacted]w[redacted]d   M Vag-Spont EPI      Past Medical History:  Diagnosis Date   Asthma    Depression    Headache    Recurrent upper respiratory infection (URI)     Past Surgical History:  Procedure Laterality Date   LIPOSUCTION MULTIPLE BODY PARTS N/A    abdomen and back   tummy tuck      Current Outpatient Medications on File Prior to Visit  Medication Sig Dispense Refill   budesonide -formoterol  (SYMBICORT ) 160-4.5 MCG/ACT inhaler Inhale 2 puffs into the lungs 2 (two) times daily. 1 each 3   diazepam  (VALIUM ) 5 MG tablet Take 1 tablet (5 mg total) by mouth every 12 (twelve) hours as needed for anxiety. 30 tablet 1   lisdexamfetamine (VYVANSE ) 40 MG capsule Take 1 capsule (40 mg total) by mouth every morning. 90 capsule 0   montelukast  (SINGULAIR ) 10 MG tablet Take 1 tablet (10 mg total) by mouth at bedtime. 90 tablet 1   sertraline  (ZOLOFT ) 100 MG tablet Take one and one-half tablet daily. 135 tablet 0   albuterol  (VENTOLIN  HFA) 108 (90 Base) MCG/ACT inhaler Inhale 1-2 puffs into the lungs every 4 (four) hours as needed for wheezing or shortness of breath. (Patient not taking:  Reported on 06/01/2024) 18 g 1   No current facility-administered medications on file prior to visit.    Allergies  Allergen Reactions   Sulfa Antibiotics Swelling    Social History   Socioeconomic History   Marital status: Legally Separated    Spouse name: Not on file   Number of children: Not on file   Years of education: Not on file   Highest education level: Not on file  Occupational History   Not on file  Tobacco Use   Smoking status: Never   Smokeless tobacco: Never  Vaping Use   Vaping status: Never Used  Substance and Sexual Activity   Alcohol use: Yes    Alcohol/week: 6.0 standard drinks of alcohol    Types: 6 Glasses of wine per week   Drug use: Yes    Types: Marijuana    Comment: Jule or vape pen   Sexual activity: Yes    Birth control/protection: None  Other Topics Concern   Not on file  Social History Narrative   Not on file   Social Drivers of Health   Financial Resource Strain: Not on file  Food Insecurity: Not on file  Transportation Needs: Not on file  Physical Activity: Not on file  Stress: Not on file  Social Connections: Not on file  Intimate Partner Violence: Not on file    Family History  Adopted: Yes  Problem Relation Age of Onset   Allergic rhinitis Neg Hx    Angioedema Neg Hx    Asthma Neg Hx    Atopy Neg Hx    Eczema Neg Hx    Immunodeficiency Neg Hx    Urticaria Neg Hx     The following portions of the patient's history were reviewed and updated as appropriate: allergies, current medications, past family history, past medical history, past social history, past surgical history and problem list.  Review of Systems Constitutional: negative Respiratory: negative Cardiovascular: negative Gastrointestinal: negative Genitourinary:recent genital lesion resolved Behavioral/Psych: positive for PMDD   Objective:  BP 124/74 (BP Location: Left Arm, Patient Position: Sitting, Cuff Size: Normal)   Pulse 89   Ht 5' 4 (1.626 m)    Wt 195 lb 0.6 oz (88.5 kg)   LMP 07/05/2024 (Exact Date)   BMI 33.48 kg/m  CONSTITUTIONAL: Well-developed, well-nourished female in no acute distress.  HENT:  Normocephalic, atraumatic, External right and left ear normal. Oropharynx is clear and moist EYES: Conjunctivae and EOM are normal. Pupils are equal, round, and reactive to light. No scleral icterus.  NECK: Normal range of motion, supple, no masses.  Normal thyroid.  SKIN: Skin is warm and dry. No rash noted. Not diaphoretic. No erythema. No pallor. NEUROLGIC: Alert and oriented to person, place, and time. Normal reflexes, muscle tone coordination. No cranial nerve deficit noted. PSYCHIATRIC: Normal mood and affect. Normal behavior. Normal judgment and thought content. CARDIOVASCULAR: Normal heart rate noted, regular rhythm RESPIRATORY: Clear to auscultation bilaterally. Effort and breath sounds normal, no problems with respiration noted. BREASTS: Symmetric in size. No masses, skin changes, nipple drainage, or lymphadenopathy. Scars well-healed and implants intact ABDOMEN: Soft, normal bowel sounds, no distention noted.  No tenderness, rebound or guarding.  PELVIC: Normal appearing external genitalia; normal appearing vaginal mucosa and cervix.  No abnormal discharge noted.  Pap smear obtained.  Normal uterine size, no other palpable masses, no uterine or adnexal tenderness. MUSCULOSKELETAL: Normal range of motion. No tenderness.  No cyanosis, clubbing, or edema.     Assessment:  Annual gynecologic examination with pap smear   Plan:  Will follow up results of pap smear and manage accordingly. Meds ordered this encounter  Medications   drospirenone -ethinyl estradiol  (YAZ) 3-0.02 MG tablet    Sig: Take 1 tablet by mouth daily.    Dispense:  28 tablet    Refill:  12    Routine preventative health maintenance measures emphasized. Please refer to After Visit Summary for other counseling recommendations.    LYNWOOD SOLOMONS,  MD Attending Obstetrician & Gynecologist Center for [employer], [facility]

## 2024-07-11 NOTE — Progress Notes (Signed)
 urinPatient presents for Annual.  LMP: No LMP recorded.  Last pap: Date: 05/02/2020 Contraception: wants to discuss [facility]  Mammogram: Not yet indicated STD Screening: Accepts Flu Vaccine : Declines  CC: Annual/None   wants to discuss her herpes outbreak

## 2024-07-12 LAB — CERVICOVAGINAL ANCILLARY ONLY
Bacterial Vaginitis (gardnerella): NEGATIVE
Candida Glabrata: NEGATIVE
Candida Vaginitis: NEGATIVE
Comment: NEGATIVE
Comment: NEGATIVE
Comment: NEGATIVE

## 2024-07-12 LAB — CYTOLOGY - PAP
Comment: NEGATIVE
Diagnosis: NEGATIVE
High risk HPV: POSITIVE — AB

## 2024-07-12 LAB — RPR+HBSAG+HCVAB+...
HIV Screen 4th Generation wRfx: NONREACTIVE
Hep C Virus Ab: NONREACTIVE
Hepatitis B Surface Ag: NEGATIVE
RPR Ser Ql: NONREACTIVE

## 2024-07-13 LAB — URINE CULTURE: Organism ID, Bacteria: NO GROWTH

## 2024-07-21 DIAGNOSIS — F4323 Adjustment disorder with mixed anxiety and depressed mood: Secondary | ICD-10-CM | POA: Diagnosis not present

## 2024-07-25 ENCOUNTER — Encounter: Payer: Self-pay | Admitting: Physician Assistant

## 2024-07-25 DIAGNOSIS — F419 Anxiety disorder, unspecified: Secondary | ICD-10-CM

## 2024-07-26 MED ORDER — DIAZEPAM 5 MG PO TABS: 5.0000 mg | ORAL_TABLET | Freq: Two times a day (BID) | ORAL | 1 refills | Status: AC | PRN

## 2024-08-29 ENCOUNTER — Telehealth: Admitting: Physician Assistant

## 2024-08-29 DIAGNOSIS — B3731 Acute candidiasis of vulva and vagina: Secondary | ICD-10-CM

## 2024-08-29 MED ORDER — TERCONAZOLE 0.8 % VA CREA
1.0000 | TOPICAL_CREAM | Freq: Every day | VAGINAL | 0 refills | Status: DC
Start: 2024-08-29 — End: 2024-08-29

## 2024-08-29 MED ORDER — TERCONAZOLE 0.8 % VA CREA: 1.0000 | TOPICAL_CREAM | Freq: Every day | VAGINAL | 0 refills | Status: AC

## 2024-08-29 NOTE — Progress Notes (Signed)
 Virtual Visit Consent   Michelle Berger, you are scheduled for a virtual visit with a [facility] provider today. Just as with appointments in the office, your consent must be obtained to participate. Your consent will be active for this visit and any virtual visit you may have with one of our providers in the next 365 days. If you have a MyChart account, a copy of this consent can be sent to you electronically.  As this is a virtual visit, video technology does not allow for your provider to perform a traditional examination. This may limit your provider's ability to fully assess your condition. If your provider identifies any concerns that need to be evaluated in person or the need to arrange testing (such as labs, EKG, etc.), we will make arrangements to do so. Although advances in technology are sophisticated, we cannot ensure that it will always work on either your end or our end. If the connection with a video visit is poor, the visit may have to be switched to a telephone visit. With either a video or telephone visit, we are not always able to ensure that we have a secure connection.  By engaging in this virtual visit, you consent to the provision of healthcare and authorize for your insurance to be billed (if applicable) for the services provided during this visit. Depending on your insurance coverage, you may receive a charge related to this service.  I need to obtain your verbal consent now. Are you willing to proceed with your visit today? Michelle Berger has provided verbal consent on 08/29/2024 for a virtual visit (video or telephone). Delon CHRISTELLA Dickinson, PA-C  Date: 08/29/2024 12:48 PM   Virtual Visit via Video Note   I, Delon CHRISTELLA Dickinson, connected with  Michelle Berger  ([id number], Nov 14, 1985) on 08/29/24 at 12:30 PM EST by a video-enabled telemedicine application and verified that I am speaking with the correct person using two  identifiers.  Location: Patient: Virtual Visit Location Patient: Home Provider: Virtual Visit Location Provider: Home Office   I discussed the limitations of evaluation and management by telemedicine and the availability of in person appointments. The patient expressed understanding and agreed to proceed.    History of Present Illness: Michelle Berger is a 38 y.o. who identifies as a female who was assigned female at birth, and is being seen today for recurrent yeast infection.  HPI: Vaginal Discharge The patient's primary symptoms include genital itching, pelvic pain (discomfort and raw feeling, not true pain) and vaginal discharge. This is a recurrent problem. The current episode started in the past 7 days. The problem occurs constantly. The problem has been unchanged. The pain is mild (irritated). Associated symptoms include painful intercourse. Pertinent negatives include no abdominal pain, back pain, chills, constipation, diarrhea, dysuria, fever, flank pain, frequency or nausea. The vaginal discharge was thick, white and green. There has been no bleeding. The symptoms are aggravated by tactile pressure. She has tried antifungals (Monistat and Fluconazole ) for the symptoms. The treatment provided no relief. She is sexually active.     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   Partner relationship problems 11/04/2023   Panic attacks 11/04/2023   Binge eating disorder 10/07/2022   Poor concentration 10/07/2022   Moderate persistent asthma with exacerbation 11/08/2021   Upper respiratory tract infection 11/08/2021   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   Allergies 11/02/2020   Childhood asthma 09/11/2020   Moderate persistent  asthma 09/11/2020   Insomnia due to other mental disorder 05/04/2020   Anxiety 12/22/2018   Marijuana use, episodic 12/22/2018   Cough 12/22/2018   History of recurrent pneumonia 11/05/2018   Plantar  fasciitis, right 01/26/2018   Motion sickness 11/18/2017   Recurrent candidiasis of vagina 05/05/2017   Abnormal weight gain 05/05/2017   Foreign body in auricle of right ear 05/05/2017   Sebaceous cyst 01/28/2017   Inattention 01/28/2017   No energy 01/28/2017   PMDD (premenstrual dysphoric disorder) 12/04/2016   Migraines 05/19/2016   History of migraine headaches 01/04/2015   Tendinitis, de Quervain's 01/04/2015   Major depression 07/12/2013   Class 2 obesity due to excess calories without serious comorbidity with body mass index (BMI) of 35.0 to 35.9 in adult 07/12/2013   Preventive measure 07/12/2013   Seasonal and perennial allergic rhinitis 07/12/2013    Allergies:  Allergies  Allergen Reactions   Sulfa Antibiotics Swelling   Medications:  Current Outpatient Medications:    albuterol  (VENTOLIN  HFA) 108 (90 Base) MCG/ACT inhaler, Inhale 1-2 puffs into the lungs every 4 (four) hours as needed for wheezing or shortness of breath. (Patient not taking: Reported on 06/01/2024), Disp: 18 g, Rfl: 1   budesonide -formoterol  (SYMBICORT ) 160-4.5 MCG/ACT inhaler, Inhale 2 puffs into the lungs 2 (two) times daily., Disp: 1 each, Rfl: 3   diazepam  (VALIUM ) 5 MG tablet, Take 1 tablet (5 mg total) by mouth every 12 (twelve) hours as needed for anxiety., Disp: 30 tablet, Rfl: 1   drospirenone -ethinyl estradiol  (YAZ) 3-0.02 MG tablet, Take 1 tablet by mouth daily., Disp: 28 tablet, Rfl: 12   lisdexamfetamine (VYVANSE ) 40 MG capsule, Take 1 capsule (40 mg total) by mouth every morning., Disp: 90 capsule, Rfl: 0   montelukast  (SINGULAIR ) 10 MG tablet, Take 1 tablet (10 mg total) by mouth at bedtime., Disp: 90 tablet, Rfl: 1   sertraline  (ZOLOFT ) 100 MG tablet, Take one and one-half tablet daily., Disp: 135 tablet, Rfl: 0   terconazole (TERAZOL 3) 0.8 % vaginal cream, Place 1 applicator vaginally at bedtime for 3 days., Disp: 20 g, Rfl: 0  Observations/Objective: Patient is well-developed,  well-nourished in no acute distress.  Resting comfortably at home.  Head is normocephalic, atraumatic.  No labored breathing.  Speech is clear and coherent with logical content.  Patient is alert and oriented at baseline.    Assessment and Plan: 1. Yeast vaginitis (Primary) - terconazole (TERAZOL 3) 0.8 % vaginal cream; Place 1 applicator vaginally at bedtime for 3 days.  Dispense: 20 g; Refill: 0  - Symptoms consistent with yeast vaginitis - Terconazole cream prescribed - Limit bubble baths, scented lotions/soaps/detergents - Limit tight fitting clothing - Seek on person evaluation if not improving or if symptoms worsen   Follow Up Instructions: I discussed the assessment and treatment plan with the patient. The patient was provided an opportunity to ask questions and all were answered. The patient agreed with the plan and demonstrated an understanding of the instructions.  A copy of instructions were sent to the patient via MyChart unless otherwise noted below.    The patient was advised to call back or seek an in-person evaluation if the symptoms worsen or if the condition fails to improve as anticipated.    Delon CHRISTELLA Dickinson, PA-C

## 2024-08-29 NOTE — Patient Instructions (Signed)
 Michelle Berger, thank you for joining Delon CHRISTELLA Dickinson, PA-C for today's virtual visit.  While this provider is not your primary care provider (PCP), if your PCP is located in our provider database this encounter information will be shared with them immediately following your visit.   A [facility] MyChart account gives you access to today's visit and all your visits, tests, and labs performed at [facility]  click here if you don't have a [facility] MyChart account or go to [email or web address]  Consent: (Patient) Michelle Berger provided verbal consent for this virtual visit at the beginning of the encounter.  Current Medications:  Current Outpatient Medications:    albuterol  (VENTOLIN  HFA) 108 (90 Base) MCG/ACT inhaler, Inhale 1-2 puffs into the lungs every 4 (four) hours as needed for wheezing or shortness of breath. (Patient not taking: Reported on 06/01/2024), Disp: 18 g, Rfl: 1   budesonide -formoterol  (SYMBICORT ) 160-4.5 MCG/ACT inhaler, Inhale 2 puffs into the lungs 2 (two) times daily., Disp: 1 each, Rfl: 3   diazepam  (VALIUM ) 5 MG tablet, Take 1 tablet (5 mg total) by mouth every 12 (twelve) hours as needed for anxiety., Disp: 30 tablet, Rfl: 1   drospirenone -ethinyl estradiol  (YAZ) 3-0.02 MG tablet, Take 1 tablet by mouth daily., Disp: 28 tablet, Rfl: 12   lisdexamfetamine (VYVANSE ) 40 MG capsule, Take 1 capsule (40 mg total) by mouth every morning., Disp: 90 capsule, Rfl: 0   montelukast  (SINGULAIR ) 10 MG tablet, Take 1 tablet (10 mg total) by mouth at bedtime., Disp: 90 tablet, Rfl: 1   sertraline  (ZOLOFT ) 100 MG tablet, Take one and one-half tablet daily., Disp: 135 tablet, Rfl: 0   terconazole (TERAZOL 3) 0.8 % vaginal cream, Place 1 applicator vaginally at bedtime for 3 days., Disp: 20 g, Rfl: 0   Medications ordered in this encounter:  Meds ordered this encounter  Medications   DISCONTD: terconazole (TERAZOL 3) 0.8 % vaginal cream    Sig:  Place 1 applicator vaginally at bedtime.    Dispense:  20 g    Refill:  0    Supervising Provider:   BLAISE ALEENE KIDD [[id number]]   terconazole (TERAZOL 3) 0.8 % vaginal cream    Sig: Place 1 applicator vaginally at bedtime for 3 days.    Dispense:  20 g    Refill:  0    Supervising Provider:   BLAISE ALEENE KIDD [[id number]]     *If you need refills on other medications prior to your next appointment, please contact your pharmacy*  Follow-Up: Call back or seek an in-person evaluation if the symptoms worsen or if the condition fails to improve as anticipated.  [facility] [phone]  Other Instructions Vaginal Probiotics: AZO vaginal probiotic OLLY Happy Hoo-Ha RAW Vaginal Care RenewLife Women's vaginal probiotic RepHresh Pro-B  Vaginal washes: Honey Pot Summer's Eve Vagisil Feminine cleanser  Boric Acid Suppositories  Healthy vaginal hygiene practices    -  Avoid sleeper pajamas. Nightgowns allow air to circulate.  Sleep without underpants whenever possible.   -  Wear cotton underpants during the day. Double-rinse underwear after washing to avoid residual irritants. Do not use fabric softeners for underwear and swimsuits.   - Avoid tights, leotards, leggings, skinny jeans, and other tight-fitting clothing. Skirts and loose-fitting pants allow air to circulate.   - Avoid pantyliners.  Instead use tampons or cotton pads.   - Use the restroom after intercourse to help prevent UTI's   - Daily warm bathing is helpful:     -  Soak in clean water (no soap) for 10 to 15 minutes. Adding vinegar or baking soda to the water has not been specifically studied and may not be better than clean water alone.      - Use soap to wash regions other than the genital area just before getting out of the tub. Limit use of any soap on genital areas. Use fragance-free soaps.     - Rinse the genital area well and gently pat dry.  Don't rub.  Hair dryer to assist with drying can be  used only if on cool setting.     - Do not use bubble baths or perfumed soaps.   - Do not use any feminine sprays, douches or powders.  These contain chemicals that will irritate the skin.   - If the genital area is tender or swollen, cool compresses may relieve the discomfort. Unscented wet wipes can be used instead of toilet paper for wiping.    - Emollients, such as Vaseline, may help protect skin and can be applied to the irritated area.   - Always remember to wipe front-to-back after bowel movements. Pat dry after urination.   - Do not sit in wet swimsuits for long periods of time after swimming    If you have been instructed to have an in-person evaluation today at a local Urgent Care facility, please use the link below. It will take you to a list of all of our available [facility], including address, phone number and hours of operation. Please do not delay care.  [facility]  If you or a family member do not have a primary care provider, use the link below to schedule a visit and establish care. When you choose a [facility] primary care physician or advanced practice provider, you gain a long-term partner in health. Find a Primary Care Provider  Learn more about [facility]'s in-office and virtual care options: [facility] - Get Care Now

## 2024-08-30 ENCOUNTER — Other Ambulatory Visit: Payer: Self-pay | Admitting: Physician Assistant

## 2024-08-30 ENCOUNTER — Encounter: Payer: Self-pay | Admitting: Physician Assistant

## 2024-08-30 MED ORDER — FLUCONAZOLE 150 MG PO TABS: ORAL_TABLET | ORAL | 0 refills | Status: DC

## 2024-09-27 ENCOUNTER — Encounter: Payer: Self-pay | Admitting: Physician Assistant

## 2024-09-27 ENCOUNTER — Ambulatory Visit: Admitting: Physician Assistant

## 2024-09-27 VITALS — BP 117/74 | HR 96 | Ht 64.0 in | Wt 187.0 lb

## 2024-09-27 DIAGNOSIS — F419 Anxiety disorder, unspecified: Secondary | ICD-10-CM

## 2024-09-27 DIAGNOSIS — B3731 Acute candidiasis of vulva and vagina: Secondary | ICD-10-CM | POA: Insufficient documentation

## 2024-09-27 DIAGNOSIS — B009 Herpesviral infection, unspecified: Secondary | ICD-10-CM

## 2024-09-27 DIAGNOSIS — F410 Panic disorder [episodic paroxysmal anxiety] without agoraphobia: Secondary | ICD-10-CM

## 2024-09-27 DIAGNOSIS — R5383 Other fatigue: Secondary | ICD-10-CM

## 2024-09-27 DIAGNOSIS — F50811 Binge eating disorder, moderate: Secondary | ICD-10-CM

## 2024-09-27 DIAGNOSIS — F3341 Major depressive disorder, recurrent, in partial remission: Secondary | ICD-10-CM

## 2024-09-27 MED ORDER — LISDEXAMFETAMINE DIMESYLATE 40 MG PO CAPS: 40.0000 mg | ORAL_CAPSULE | ORAL | 0 refills | Status: AC

## 2024-09-27 MED ORDER — VALACYCLOVIR HCL 500 MG PO TABS: 500.0000 mg | ORAL_TABLET | Freq: Two times a day (BID) | ORAL | 1 refills | Status: AC

## 2024-09-27 MED ORDER — SERTRALINE HCL 100 MG PO TABS: ORAL_TABLET | ORAL | 3 refills | Status: AC

## 2024-09-27 MED ORDER — FLUCONAZOLE 150 MG PO TABS: ORAL_TABLET | ORAL | 0 refills | Status: DC

## 2024-09-27 NOTE — Progress Notes (Signed)
 Established Patient Office Visit  Subjective   Patient ID: Michelle Berger, female    DOB: 06/11/1986  Age: 38 y.o. MRN: [id number]  Chief Complaint  Patient presents with   Medical Management of Chronic Issues    HPI .SABRADiscussed the use of AI scribe software for clinical note transcription with the patient, who gave verbal consent to proceed.  History of Present Illness Michelle Berger is a 38 year old female who presents with recurrent yeast infections.  Recurrent vulvovaginal candidiasis - Recurrent yeast infections occurring two to four days prior to onset of menses - Discharge described as 'cottage cheese' like, associated with pruritus and irritation - Skin peeling around the perianal area - History of both yeast infections and bacterial vaginosis - Increased sexual activity recently, aware of its impact on vaginal pH - No current relationship  Vaginal ph alterations - Tracks vaginal pH, which tends to be higher before menstrual cycle - Uses boric acid suppositories for management  Hormonal therapy cessation - History of testosterone supplementation (0.2 to 0.3 weekly) - Testosterone therapy discontinued in February or March of this year - Last testosterone level before replacement was very low.  - Questions possible link between cessation of testosterone and recurrent infections  Medication use - Currently taking sertraline  (Zoloft ) and Vyvanse  - Uses Valtrex  as needed for stress-related outbreaks  Psychosocial well-being - Stress levels have decreased - Feels the happiest she has been in a long time - Focusing on self-improvement  She does feel like she has outbreak of herpes on her right inner thigh     ROS See HPI.    Objective:     BP 117/74   Pulse 96   Ht 5' 4 (1.626 m)   Wt 187 lb (84.8 kg)   SpO2 99%   BMI 32.10 kg/m  BP Readings from Last 3 Encounters:  09/27/24 117/74  07/11/24 124/74  06/01/24 118/67   Wt Readings  from Last 3 Encounters:  09/27/24 187 lb (84.8 kg)  07/11/24 195 lb 0.6 oz (88.5 kg)  06/01/24 194 lb (88 kg)    .[name]    09/27/2024   12:09 PM 07/11/2024   10:42 AM 06/01/2024    4:47 PM 11/04/2023   10:41 AM 02/02/2023   11:37 AM  Depression screen PHQ 2/9  Decreased Interest 0 1 2 2  0  Down, Depressed, Hopeless 0 2 2 2  0  PHQ - 2 Score 0 3 4 4  0  Altered sleeping 2 3 3 1  0  Tired, decreased energy 3 1 2  0 2  Change in appetite 1 1 0 2 0  Feeling bad or failure about yourself  0 2 0 1 0  Trouble concentrating 1 2 1 1  0  Moving slowly or fidgety/restless 0 0 0 0 0  Suicidal thoughts 0 0 0 0 0  PHQ-9 Score 7 12  10  9  2    Difficult doing work/chores Not difficult at all  Somewhat difficult Not difficult at all Not difficult at all     Data saved with a previous flowsheet row definition   .[name]    09/27/2024   12:10 PM 07/11/2024   10:42 AM 06/01/2024    4:47 PM 11/04/2023   10:42 AM  GAD 7 : Generalized Anxiety Score  Nervous, Anxious, on Edge 1 3 3 3   Control/stop worrying 1 3 3 3   Worry too much - different things 1 3 3 3   Trouble relaxing 0 3 3 3  Restless 0 2 0 2  Easily annoyed or irritable 0 3 1 2   Afraid - awful might happen 0 3 3 3   Total GAD 7 Score 3 20 16 19   Anxiety Difficulty Not difficult at all  Somewhat difficult Not difficult at all      Physical Exam Constitutional:      Appearance: Normal appearance. She is obese.  Cardiovascular:     Rate and Rhythm: Normal rate and regular rhythm.  Pulmonary:     Effort: Pulmonary effort is normal.     Breath sounds: Normal breath sounds.  Abdominal:     General: There is no distension.     Palpations: Abdomen is soft.     Tenderness: There is no abdominal tenderness.  Neurological:     General: No focal deficit present.     Mental Status: She is alert and oriented to person, place, and time.  Psychiatric:        Mood and Affect: Mood normal.        Assessment & Plan:  [name] was seen today for  medical management of chronic issues.  Diagnoses and all orders for this visit:  Yeast vaginitis -     WET PREP FOR TRICH, YEAST, CLUE -     fluconazole  (DIFLUCAN ) 150 MG tablet; Take one tablet now and then repeat in 48-72 hours. -     Testosterone -     FSH/LH -     Estradiol  -     CMP14+EGFR -     CBC w/Diff/Platelet -     TSH + free T4  Moderate binge-eating disorder -     Testosterone -     FSH/LH -     Estradiol  -     CMP14+EGFR -     CBC w/Diff/Platelet -     TSH + free T4 -     lisdexamfetamine  (VYVANSE ) 40 MG capsule; Take 1 capsule (40 mg total) by mouth every morning.  Low energy -     Testosterone -     FSH/LH -     Estradiol  -     CMP14+EGFR -     CBC w/Diff/Platelet -     TSH + free T4  Recurrent major depressive disorder, in partial remission -     sertraline  (ZOLOFT ) 100 MG tablet; Take one and one-half tablet daily.  Anxiety -     sertraline  (ZOLOFT ) 100 MG tablet; Take one and one-half tablet daily.  Panic attacks -     sertraline  (ZOLOFT ) 100 MG tablet; Take one and one-half tablet daily.   Assessment & Plan Recurrent vulvovaginal candidiasis Recurrent yeast infections, particularly premenstrually, with symptoms of discharge, itching, and irritation. Possible contributing factors include hormonal changes and sexual activity. Differential diagnosis includes bacterial vaginosis. - Use boric acid suppositories weekly, increasing frequency premenstrually and postmenstrually. - Monitor vaginal pH regularly to identify trends and potential triggers. - Prescribed Diflucan  for symptomatic treatment. - Wet prep ordered today - Hormone Panel Ordered today, not started OCP at this time.   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 Stress identified as a trigger for outbreaks. - Refilled Valtrex  prescription for outbreak management.  Major depressive disorder, recurrent, in partial remission PHQ much improved.   Reports significant improvement in depressive symptoms. Continues on sertraline  150 mg daily. - Continue sertraline  150 mg daily, refilled today.   Anxiety disorder Anxiety symptoms manageable with current treatment. Reports overall improvement and focus on self-care. GAD- much  improved.  - Continue sertraline  150mg  daily, refilled today.   Binge-Eating Vitals look great. Weight stable. - Continue Vyvanse , refilled today.   General health maintenance Discussed monitoring vaginal pH and hormonal influences on yeast infections. Considered testosterone levels due to previous use and cessation. - Checked testosterone levels to assess need for replacement therapy. - Encouraged regular monitoring of vaginal pH to prevent infections.     Return in about 6 months (around 03/28/2025).    Ameirah Khatoon, PA-C

## 2024-09-28 ENCOUNTER — Ambulatory Visit: Payer: Self-pay | Admitting: Physician Assistant

## 2024-09-28 LAB — FSH/LH
FSH: 6.5 m[IU]/mL
LH: 4.2 m[IU]/mL

## 2024-09-28 LAB — CMP14+EGFR
ALT: 13 IU/L (ref 0–32)
AST: 7 IU/L (ref 0–40)
Albumin: 4.5 g/dL (ref 3.9–4.9)
Alkaline Phosphatase: 92 IU/L (ref 41–116)
BUN/Creatinine Ratio: 11 (ref 9–23)
BUN: 8 mg/dL (ref 6–20)
Bilirubin Total: 0.5 mg/dL (ref 0.0–1.2)
CO2: 22 mmol/L (ref 20–29)
Calcium: 9.1 mg/dL (ref 8.7–10.2)
Chloride: 101 mmol/L (ref 96–106)
Creatinine, Ser: 0.71 mg/dL (ref 0.57–1.00)
Globulin, Total: 2.3 g/dL (ref 1.5–4.5)
Glucose: 77 mg/dL (ref 70–99)
Potassium: 4.6 mmol/L (ref 3.5–5.2)
Sodium: 137 mmol/L (ref 134–144)
Total Protein: 6.8 g/dL (ref 6.0–8.5)
eGFR: 111 mL/min/1.73 (ref >59)

## 2024-09-28 LAB — WET PREP FOR TRICH, YEAST, CLUE
Clue Cell Exam: NEGATIVE
Trichomonas Exam: NEGATIVE
Yeast Exam: NEGATIVE

## 2024-09-28 LAB — ESTRADIOL: Estradiol: 22.3 pg/mL

## 2024-09-28 LAB — CBC WITH DIFFERENTIAL/PLATELET
Basophils Absolute: 0.1 x10E3/uL (ref 0.0–0.2)
Basos: 1 %
EOS (ABSOLUTE): 0.4 x10E3/uL (ref 0.0–0.4)
Eos: 7 %
Hematocrit: 40.5 % (ref 34.0–46.6)
Hemoglobin: 13.5 g/dL (ref 11.1–15.9)
Immature Grans (Abs): 0 x10E3/uL (ref 0.0–0.1)
Immature Granulocytes: 0 %
Lymphocytes Absolute: 1.8 x10E3/uL (ref 0.7–3.1)
Lymphs: 29 %
MCH: 33.1 pg — ABNORMAL HIGH (ref 26.6–33.0)
MCHC: 33.3 g/dL (ref 31.5–35.7)
MCV: 99 fL — ABNORMAL HIGH (ref 79–97)
Monocytes Absolute: 0.5 x10E3/uL (ref 0.1–0.9)
Monocytes: 8 %
Neutrophils Absolute: 3.4 x10E3/uL (ref 1.4–7.0)
Neutrophils: 55 %
Platelets: 207 x10E3/uL (ref 150–450)
RBC: 4.08 x10E6/uL (ref 3.77–5.28)
RDW: 12 % (ref 11.7–15.4)
WBC: 6.1 x10E3/uL (ref 3.4–10.8)

## 2024-09-28 LAB — TESTOSTERONE: Testosterone: 25 ng/dL (ref 8–60)

## 2024-09-28 LAB — TSH+FREE T4
Free T4: 0.99 ng/dL (ref 0.82–1.77)
TSH: 1.53 u[IU]/mL (ref 0.450–4.500)

## 2024-09-28 NOTE — Progress Notes (Signed)
 No yeast, BV or Trichomonas.

## 2024-09-28 NOTE — Progress Notes (Signed)
 Michelle Berger,   Normal testosterone is 15-70 you are at 25. Considered normal.  Estrogen low side of normal.  I would start the birth control and just see if helps you feel better!

## 2024-09-29 DIAGNOSIS — F4323 Adjustment disorder with mixed anxiety and depressed mood: Secondary | ICD-10-CM | POA: Diagnosis not present

## 2024-11-09 ENCOUNTER — Encounter: Payer: Self-pay | Admitting: Physician Assistant

## 2024-11-11 MED ORDER — TIRZEPATIDE 10 MG/0.5ML ~~LOC~~ SOAJ: SUBCUTANEOUS | 11 refills | Status: AC

## 2024-11-11 NOTE — Addendum Note (Signed)
 Addended by: ANTONIETTE VERMELL CROME on: 11/11/2024 03:44 PM   Modules accepted: Orders

## 2024-11-23 ENCOUNTER — Encounter: Payer: Self-pay | Admitting: Physician Assistant

## 2024-11-23 ENCOUNTER — Ambulatory Visit: Admitting: Physician Assistant

## 2024-11-23 VITALS — BP 126/82 | HR 84 | Ht 64.0 in | Wt 192.8 lb

## 2024-11-23 DIAGNOSIS — Z630 Problems in relationship with spouse or partner: Secondary | ICD-10-CM | POA: Diagnosis not present

## 2024-11-23 DIAGNOSIS — F419 Anxiety disorder, unspecified: Secondary | ICD-10-CM

## 2024-11-23 DIAGNOSIS — F3341 Major depressive disorder, recurrent, in partial remission: Secondary | ICD-10-CM

## 2024-11-23 DIAGNOSIS — F410 Panic disorder [episodic paroxysmal anxiety] without agoraphobia: Secondary | ICD-10-CM | POA: Diagnosis not present

## 2024-11-23 MED ORDER — BUSPIRONE HCL 7.5 MG PO TABS: 7.5000 mg | ORAL_TABLET | Freq: Two times a day (BID) | ORAL | 1 refills | Status: AC

## 2024-11-23 NOTE — Progress Notes (Signed)
 "  Established Patient Office Visit  Subjective   Patient ID: Michelle Berger, female    DOB: [birth year]  Age: 39 y.o. MRN: [id number]   HPI .Discussed the use of AI scribe software for clinical note transcription with the patient, who gave verbal consent to proceed.  History of Present Illness Michelle Berger is a 39 year old female who presents with increased anxiety and panic symptoms and ongoing depression while going through with divorce of husband of 18 years.   Anxiety and panic symptoms - Persistent heightened anxiety and panic symptoms, described as being in 'panic mode at all times'. - Recent intensification of anxiety, possibly related to ongoing legal issues. - Episodes of feeling as if she is 'dying' and experiencing an 'outer body experience'. - Constantly on edge and unable to control emotions, impacting daily functioning. - Feelings of worthlessness and being overwhelmed by the prospect of returning to work. - Recent incident of feeling 'crazy' after being unable to access her bank account, leading to confrontation with ex-partner and increased stress due to legal implications. - Significant stressor from her son recently moving to college, contributing to sense of loss and lack of control.  Mood disturbance and coping mechanisms - Struggles with depression, using overeating as a coping mechanism. - Conflicted feelings about past relationship, expressing regret and questioning past decisions.  Somatic symptoms - Increased twitching in the body, particularly in the legs. - Sensation of something 'living in her body'.  Hormonal sensitivity and medication changes - Discontinued birth control one month ago due to belief it contributed to emotional instability, with no improvement in symptoms since cessation.  Psychiatric treatment and medication - Attending weekly therapy sessions, which are helpful but sometimes exacerbate anxiety when discussing  issues. - History of using Valium  for acute anxiety relief, effective but cautious about overuse. - Previous intolerance to Xanax. - Currently taking Zoloft  daily    ROS See HPI.    Objective:     BP 126/82   Pulse 84   Ht 5' 4 (1.626 m)   Wt 192 lb 12 oz (87.4 kg)   SpO2 98%   BMI 33.09 kg/m  BP Readings from Last 3 Encounters:  11/23/24 126/82  09/27/24 117/74  07/11/24 124/74   Wt Readings from Last 3 Encounters:  11/23/24 192 lb 12 oz (87.4 kg)  09/27/24 187 lb (84.8 kg)  07/11/24 195 lb 0.6 oz (88.5 kg)    .    11/23/2024    9:50 AM 09/27/2024   12:09 PM 07/11/2024   10:42 AM 06/01/2024    4:47 PM 11/04/2023   10:41 AM  Depression screen PHQ 2/9  Decreased Interest 3 0 1 2 2   Down, Depressed, Hopeless 3 0 2 2 2   PHQ - 2 Score 6 0 3 4 4   Altered sleeping 3 2 3 3 1   Tired, decreased energy 3 3 1 2  0  Change in appetite 3 1 1  0 2  Feeling bad or failure about yourself  3 0 2 0 1  Trouble concentrating 3 1 2 1 1   Moving slowly or fidgety/restless 3 0 0 0 0  Suicidal thoughts 1 0 0 0 0  PHQ-9 Score 25 7 12  10  9    Difficult doing work/chores Extremely dIfficult Not difficult at all  Somewhat difficult Not difficult at all     Data saved with a previous flowsheet row definition   ..    11/23/2024    9:51 AM  09/27/2024   12:10 PM 07/11/2024   10:42 AM 06/01/2024    4:47 PM  GAD 7 : Generalized Anxiety Score  Nervous, Anxious, on Edge 3 1  3  3    Control/stop worrying 3 1  3  3    Worry too much - different things 3 1  3  3    Trouble relaxing 3 0  3  3   Restless 3 0  2  0   Easily annoyed or irritable 3 0  3  1   Afraid - awful might happen 3 0  3  3   Total GAD 7 Score 21 3 20 16   Anxiety Difficulty Extremely difficult Not difficult at all  Somewhat difficult     Data saved with a previous flowsheet row definition      Physical Exam Neurological:     Mental Status: She is alert.  Psychiatric:     Comments: Very tearful.        Assessment &  Plan:  .Veanna was seen today for medical management of chronic issues.  Diagnoses and all orders for this visit:  Recurrent major depressive disorder, in partial remission  Anxiety -     busPIRone  (BUSPAR ) 7.5 MG tablet; Take 1 tablet (7.5 mg total) by mouth 2 (two) times daily.  Panic attacks -     busPIRone  (BUSPAR ) 7.5 MG tablet; Take 1 tablet (7.5 mg total) by mouth 2 (two) times daily.  Partner relationship problems    Assessment & Plan Major depressive disorder, recurrent, moderate PHQ not to goal Increased emotional distress and panic likely due to legal and personal stressors. No self-harm thoughts but feelings of unworthiness and lack of self-control. Weekly therapy beneficial but distressing. - Continue weekly therapy sessions. - Encouraged finding purpose and meaning post-closure of legal matters. - Continue zoloft  daily.   Anxiety disorder with panic attacks Heightened anxiety and panic attacks exacerbated by stressors. Valium  used for acute relief; concern about dependency. Discussed Buspar  for sustained relief with minimal side effects. - Prescribed Buspar  for sustained anxiety relief, take one tablet twice a day.  - Continue Valium  as needed, up to twice daily during intense anxiety episodes. - Monitor for side effects of Buspar , particularly constipation. - Plan to taper off Buspar  and valium  in future.  - Continue weekly therapy sessions.  - Encouraged meditation daily.      Return in about 6 weeks (around 01/04/2025).    Erie Radu, PA-C  "

## 2024-11-23 NOTE — Patient Instructions (Signed)
 Start buspar  7.5mg  twice a day.  Valium  as needed up to twice a day.  Continue zoloft .

## 2024-11-24 ENCOUNTER — Other Ambulatory Visit: Payer: Self-pay | Admitting: Physician Assistant

## 2024-11-28 ENCOUNTER — Ambulatory Visit: Admitting: Physician Assistant
